# Patient Record
Sex: Male | Born: 1935 | Race: White | Hispanic: No | Marital: Married | Smoking: Former smoker
Health system: Southern US, Community
[De-identification: ages and names within clinical notes are randomized; demographics above are authoritative.]

## PROBLEM LIST (undated history)

## (undated) DIAGNOSIS — K219 Gastro-esophageal reflux disease without esophagitis: Secondary | ICD-10-CM

## (undated) DIAGNOSIS — I5189 Other ill-defined heart diseases: Secondary | ICD-10-CM

## (undated) DIAGNOSIS — I219 Acute myocardial infarction, unspecified: Secondary | ICD-10-CM

## (undated) DIAGNOSIS — I10 Essential (primary) hypertension: Secondary | ICD-10-CM

## (undated) DIAGNOSIS — I509 Heart failure, unspecified: Secondary | ICD-10-CM

## (undated) DIAGNOSIS — I255 Ischemic cardiomyopathy: Secondary | ICD-10-CM

## (undated) DIAGNOSIS — I5022 Chronic systolic (congestive) heart failure: Secondary | ICD-10-CM

## (undated) DIAGNOSIS — I639 Cerebral infarction, unspecified: Secondary | ICD-10-CM

## (undated) DIAGNOSIS — M1990 Unspecified osteoarthritis, unspecified site: Secondary | ICD-10-CM

## (undated) DIAGNOSIS — Z95810 Presence of automatic (implantable) cardiac defibrillator: Secondary | ICD-10-CM

## (undated) DIAGNOSIS — E7800 Pure hypercholesterolemia, unspecified: Secondary | ICD-10-CM

## (undated) DIAGNOSIS — R0602 Shortness of breath: Secondary | ICD-10-CM

## (undated) DIAGNOSIS — I447 Left bundle-branch block, unspecified: Secondary | ICD-10-CM

## (undated) DIAGNOSIS — I350 Nonrheumatic aortic (valve) stenosis: Secondary | ICD-10-CM

## (undated) DIAGNOSIS — I519 Heart disease, unspecified: Secondary | ICD-10-CM

## (undated) HISTORY — DX: Heart disease, unspecified: I51.9

## (undated) HISTORY — DX: Chronic systolic (congestive) heart failure: I50.22

## (undated) HISTORY — DX: Other ill-defined heart diseases: I51.89

## (undated) HISTORY — DX: Essential (primary) hypertension: I10

## (undated) HISTORY — PX: CATARACT EXTRACTION: SUR2

## (undated) HISTORY — PX: PARTIAL HIP ARTHROPLASTY: SHX733

## (undated) HISTORY — DX: Pure hypercholesterolemia, unspecified: E78.00

## (undated) HISTORY — DX: Nonrheumatic aortic (valve) stenosis: I35.0

## (undated) HISTORY — DX: Left bundle-branch block, unspecified: I44.7

## (undated) HISTORY — DX: Gastro-esophageal reflux disease without esophagitis: K21.9

---

## 2004-08-21 ENCOUNTER — Ambulatory Visit: Payer: Self-pay | Admitting: Family Medicine

## 2005-02-12 ENCOUNTER — Ambulatory Visit: Payer: Self-pay | Admitting: Family Medicine

## 2005-08-13 ENCOUNTER — Ambulatory Visit: Payer: Self-pay | Admitting: Family Medicine

## 2008-04-13 ENCOUNTER — Observation Stay (HOSPITAL_COMMUNITY): Admission: EM | Admit: 2008-04-13 | Discharge: 2008-04-15 | Payer: Self-pay | Admitting: Emergency Medicine

## 2008-04-13 ENCOUNTER — Ambulatory Visit: Payer: Self-pay | Admitting: Cardiovascular Disease

## 2010-05-05 ENCOUNTER — Ambulatory Visit: Payer: Self-pay | Admitting: Cardiology

## 2010-11-05 ENCOUNTER — Ambulatory Visit (INDEPENDENT_AMBULATORY_CARE_PROVIDER_SITE_OTHER): Payer: Medicare Other | Admitting: Cardiology

## 2010-11-05 DIAGNOSIS — I4460 Unspecified fascicular block: Secondary | ICD-10-CM

## 2010-11-05 DIAGNOSIS — E7800 Pure hypercholesterolemia, unspecified: Secondary | ICD-10-CM

## 2010-11-05 DIAGNOSIS — I119 Hypertensive heart disease without heart failure: Secondary | ICD-10-CM

## 2010-11-17 ENCOUNTER — Ambulatory Visit (INDEPENDENT_AMBULATORY_CARE_PROVIDER_SITE_OTHER): Payer: Medicare Other | Admitting: *Deleted

## 2010-11-17 DIAGNOSIS — R0989 Other specified symptoms and signs involving the circulatory and respiratory systems: Secondary | ICD-10-CM

## 2010-11-17 DIAGNOSIS — I10 Essential (primary) hypertension: Secondary | ICD-10-CM

## 2010-11-18 ENCOUNTER — Encounter: Payer: Self-pay | Admitting: Cardiology

## 2010-11-18 DIAGNOSIS — R0989 Other specified symptoms and signs involving the circulatory and respiratory systems: Secondary | ICD-10-CM | POA: Insufficient documentation

## 2010-11-19 ENCOUNTER — Ambulatory Visit: Payer: Medicare Other | Admitting: Cardiology

## 2010-11-20 ENCOUNTER — Encounter (INDEPENDENT_AMBULATORY_CARE_PROVIDER_SITE_OTHER): Payer: Medicare Other

## 2010-11-20 ENCOUNTER — Encounter: Payer: Self-pay | Admitting: Cardiology

## 2010-11-20 DIAGNOSIS — I10 Essential (primary) hypertension: Secondary | ICD-10-CM

## 2010-11-20 DIAGNOSIS — I714 Abdominal aortic aneurysm, without rupture: Secondary | ICD-10-CM

## 2010-11-20 DIAGNOSIS — I723 Aneurysm of iliac artery: Secondary | ICD-10-CM

## 2010-11-25 NOTE — Miscellaneous (Signed)
Summary: Orders Update  Clinical Lists Changes  Problems: Added new problem of ABDOMINAL BRUIT (ICD-785.9) Orders: Added new Test order of Renal Artery Duplex (Renal Artery Duplex) - Signed

## 2010-12-01 ENCOUNTER — Ambulatory Visit (INDEPENDENT_AMBULATORY_CARE_PROVIDER_SITE_OTHER): Payer: Medicare Other | Admitting: Nurse Practitioner

## 2010-12-01 DIAGNOSIS — Z7901 Long term (current) use of anticoagulants: Secondary | ICD-10-CM

## 2010-12-01 DIAGNOSIS — I359 Nonrheumatic aortic valve disorder, unspecified: Secondary | ICD-10-CM

## 2010-12-01 DIAGNOSIS — I10 Essential (primary) hypertension: Secondary | ICD-10-CM

## 2011-02-16 NOTE — H&P (Signed)
NAME:  Corey Mccullough, Corey Mccullough                  ACCOUNT NO.:  [id number]   MEDICAL RECORD NO.:  [id number]          PATIENT TYPE:  EMS   LOCATION:  MAJO                         FACILITY:  [facility]   PHYSICIAN:  Christell Faith, MD   DATE OF BIRTH:  01-16-1935   DATE OF ADMISSION:  04/13/2008  DATE OF DISCHARGE:                              HISTORY & PHYSICAL   The patient is being admitted to Dr. Lady Deutscher with [facility].   CHIEF COMPLAINT:  Racing heart.   PRIMARY CARE PHYSICIAN:  Dina Rich in [place], [place].   HISTORY OF PRESENT ILLNESS:  This is a 76 year old white man with a  history of irregular heartbeat and weak heart who was resting in his  chair today when he felt sudden onset of racing heart.  This lasted  greater than 2 hours until EMS gave IV diltiazem.  We do not have the  strips of the onset or termination of the rhythm.  The patient denies  chest pain or shortness of breath with the episode.  He denies syncope,  but he did feel mildly dizzy trying to ambulate while tachycardic.  Similar episodes have happened 2-3 times prior, most recently 6 weeks  ago.  In the past, these episodes have terminated spontaneously after 30-  45 minutes.  These racing heart episodes are different from his  irregular heartbeat, which is nonsustained.  The patient denies recent  angina or congestive heart failure symptoms.  He was evaluated one time  by [facility] and states that he had a negative nuclear stress  test, that he also states that he has been told that he has had a  myocardial infarction, although he was never admitted to the hospital  for that and doesn't remember it happening.   PAST MEDICAL HISTORY:  1. Hypertension.  2. Hyperlipidemia.  3. Irregular heartbeat, details unclear.  4. Status post TIA in 2000, which affected the right side of his body      and for which he was hospitalized.  He has had almost full      recovery.  5. GERD.  6.  Status post left hip fracture and hemiarthroplasty  7. Arthritis of the large weightbearing joints.  8. Restless leg syndrome.  9. Borderline diabetes.   SOCIAL HISTORY:  [place].  He  works part-time Water quality scientist.  He smokes a pack and half a  day of cigarettes for 50 years, but quit 5 years ago.  He admits to  regular alcohol use, but does not quantitate it.  His family indicates  that he has fairly heavy alcohol consumption.  He denies caffeine use.   FAMILY HISTORY:  The patient is unaware of any of his family members'  medical history.   ALLERGIES:  No known drug allergies.   MEDICATIONS:  1. Diovan HCT 160/12.5 mg p.o. daily.  2. Coreg 12.5 mg p.o. b.i.d.  3. Lipitor 80 mg p.o. daily.  4. Aspirin 325 mg p.o. daily.  5. Prilosec 20 mg p.o. daily.   REVIEW  OF SYSTEMS:  Positive for palpitations, occasionally irregular  heartbeat, joint pain and GERD symptoms.  Otherwise, the balance of 14  systems are reviewed and is negative.   PHYSICAL EXAMINATION:  VITAL SIGNS:  Temperature 100.0; pulse earlier  was 166 beats a minute, currently is 69 beats per minute; respiratory  rate 12; blood pressure 92/62 initially, currently 114/70; and  saturation 98% on room air.  GENERAL:  This is a very pleasant stoic white man in no acute distress.  HEENT:  Pupils are equal, round, and reactive.  Sclerae are clear.  Mucous membranes are moist.  There are no oral lesions.  He has top and  bottom dentures.  NECK:  Supple.  There is no cervical lymphadenopathy.  No carotid  bruits.  Carotid upstrokes are normal.  Neck veins are flat.  LUNGS:  Clear to auscultation bilaterally without any wheezing or rales.  There  is normal chest wall expansion.  CARDIAC:  Normal rate and regular rhythm.  No gallop.  There is a 2/6  systolic ejection murmur heard at the base of the heart and a 2/6 soft  holosystolic murmur heard in the mid axillary line.   ABDOMEN:  Protuberant, but soft and nontender.  Normal bowel sounds and  no bruits.  EXTREMITIES:  Reveal no edema.  No petechiae.  No clubbing, or cyanosis.  There is chronic toenail fungus. MUSCULOSKELETAL:  No acute joint  deformities or swelling.  NEURO:  There  is an intention tremor with both hands, right greater  than left.  Otherwise, the patient is awake, alert, and oriented x3 with  5/5 strength in all 4 extremities and normal sensation.  SKIN:  No rash or lesions.   DIAGNOSTIC TESTS:  Chest -Ray reveals borderline cardiomegaly with no  acute process.   EKG #1 which was obtained by EMS shows a wide complex tachycardia at 166  beats per minute with a left bundle branch block morphology.  Based on  Brugada criteria for left bundle branch morphology, this appears to be  SVT.  EKG #2 performed 2 hours later shows normal sinus rhythm with a  rate of 73 beats per minute and a prolonged PR interval at 262  milliseconds.   LABORATORY VALUES:  Labs reveal point of care CK-MB 2.3 and point of  care troponin 0.07.  White blood cell 5.6, hemoglobin 13.9, platelets  206, and INR 1.0.  Sodium 136, potassium 4.5, glucose 117, BUN 11,  creatinine 1.1, AST 20, ALT 2.4, albumin 3.2, and total bilirubin 0.7.   IMPRESSION:  This is a 76 year old white man with a wide complex  tachycardia today, which is probably SVT.  It resolved with diltiazem,  and he is currently asymptomatic.  Point of care troponin is mildly  abnormal.   PLAN:  1. Admit to telemetry to Dr. Lady Deutscher.  Rule out myocardial      infarction by cycling serial EKGs and cardiac enzymes.  2. We will check transthoracic echocardiogram.  3. For his SVT, ideally we would increase his beta-blocker; however,      first-degree heart block may inhibit our ability to do this.  We      will check transthoracic echo first and adjust medicines based on      the results.  4. Check fasting lipid panel, TSH, and magnesium level.  5.  We will continue his statin, aspirin, and antihypertensives.  6. The patient may need outpatient Myoview.  We will attempt to obtain  the results of his stress test from      [facility].  He is unclear when that was done.  7. Consider EP consult for possible SVT ablation if the patient is      interested.  8. The patient will be ambulatory and should not require DVT      prophylaxis.      Christell Faith, MD  Electronically Signed     NDL/MEDQ  D:  04/13/2008  T:  04/14/2008  Job:  [id number]

## 2011-02-16 NOTE — Discharge Summary (Signed)
NAME:  Corey Mccullough, Corey Mccullough                  ACCOUNT NO.:  [id number]   MEDICAL RECORD NO.:  [id number]          PATIENT TYPE:  OBV   LOCATION:  3711                         FACILITY:  [facility]   PHYSICIAN:  Elmore Guise., M.D.DATE OF BIRTH:  1935-07-22   DATE OF ADMISSION:  04/13/2008  DATE OF DISCHARGE:  04/15/2008                               DISCHARGE SUMMARY   DISCHARGE DIAGNOSES:  1. Palpitations with wide complex tachycardia.  2. Hypertension.  3. Dyslipidemia.  4. Gastroesophageal reflux disease.  5. Obesity.   HISTORY OF PRESENT ILLNESS:  Corey Mccullough is a very pleasant 76 year old  white male who presented with palpitations.  The patient reports he has  been having these spells for the last couple years.  He awoke with his  heart racing and pounding.  This did not improve over the next 30  minutes; EMS was notified.  On arrival, EMS noted that he was  tachycardic with heart rates in the 150-160 range with wide complex  morphology.  This was initially thought SVT with aberrancy.  He was  given diltiazem IV with resolution.  Once he arrived in the emergency  room, his heart rate was normal sinus rhythm with rates in the 30s.  He  was admitted for observation.   HOSPITAL COURSE:  The patient's hospital course was uncomplicated.  He  was kept on telemetry monitoring for 36 hours.  He had no further  tachyarrhythmias.  We kept him on his normal medications including Coreg  12.5 mg twice daily.  His blood pressure was stable.  His cardiac  markers were negative.  His TSH was normal.  His renal function, LFTs,  hemoglobin A1c all were normal.  His ECG showed normal sinus rhythm with  old inferior MI (inferior Q-waves) with no ST or T wave changes.  The  patient does report that he has had a workup with [facility] in  [place] but would like to transfer care to [facility] for further  evaluation.  Since the patient had no further events, he will be  discharged today.   DISCHARGE MEDICATIONS:  Include:  1. Diovan/hydrochlorothiazide 160/12.5 mg daily.  2. Lipitor 80 mg daily.  3. Aspirin 325 mg daily.  4. Prilosec 20 mg daily.  5. Coreg 12.5 mg twice daily.  6. Diltiazem 30 mg p.o. q.8 hours p.r.n. palpitations.   His followup appointment will be with Dr. Reyes Mccullough in 2-3 weeks.  I will  try and get his most recent evaluation from [facility] in  [place].  He is to call the office if he has any further problems.     Elmore Guise., M.D.  Electronically Signed    TWK/MEDQ  D:  04/15/2008  T:  04/15/2008  Job:  [id number]

## 2011-03-02 ENCOUNTER — Encounter: Payer: Self-pay | Admitting: Cardiology

## 2011-03-02 ENCOUNTER — Ambulatory Visit (INDEPENDENT_AMBULATORY_CARE_PROVIDER_SITE_OTHER): Payer: Medicare Other | Admitting: Cardiology

## 2011-03-02 DIAGNOSIS — I5042 Chronic combined systolic (congestive) and diastolic (congestive) heart failure: Secondary | ICD-10-CM | POA: Insufficient documentation

## 2011-03-02 DIAGNOSIS — I359 Nonrheumatic aortic valve disorder, unspecified: Secondary | ICD-10-CM

## 2011-03-02 DIAGNOSIS — I447 Left bundle-branch block, unspecified: Secondary | ICD-10-CM | POA: Insufficient documentation

## 2011-03-02 DIAGNOSIS — I119 Hypertensive heart disease without heart failure: Secondary | ICD-10-CM | POA: Insufficient documentation

## 2011-03-02 DIAGNOSIS — E7800 Pure hypercholesterolemia, unspecified: Secondary | ICD-10-CM | POA: Insufficient documentation

## 2011-03-02 DIAGNOSIS — I10 Essential (primary) hypertension: Secondary | ICD-10-CM

## 2011-03-02 DIAGNOSIS — I714 Abdominal aortic aneurysm, without rupture: Secondary | ICD-10-CM | POA: Insufficient documentation

## 2011-03-02 DIAGNOSIS — I350 Nonrheumatic aortic (valve) stenosis: Secondary | ICD-10-CM | POA: Insufficient documentation

## 2011-03-02 NOTE — Progress Notes (Signed)
Corey Mccullough Date of Birth:  1935-04-26 [facility] [address]   [address] [place], [place]  [address] [phone]           Fax   [phone]  History of Present Illness: This pleasant 76 year old gentleman is a former patient of Dr. Reyes Ivan.  He has a history of essential hypertension and a history of hypercholesterolemia.  He also has a known abdominal aortic aneurysm.  He's had dyslipidemia.  He had a nuclear stress test done in 2008 at [facility] using adenosine protocol and he showed no ischemia but he showed an old myocardial infarction involving the inferior wall and his ejection fraction was 47%.  He had an echocardiogram done here on 10/29/09 showing mild to moderate aortic stenosis and showing moderate LVH with global left ventricular systolic dysfunction and impaired relaxation with an ejection fraction of 35-40%.  He has a new left bundle-branch block noted in February 2012  Current Outpatient Prescriptions  Medication Sig Dispense Refill  . aspirin 325 MG tablet Take 325 mg by mouth daily.        Marland Kitchen atorvastatin (LIPITOR) 80 MG tablet Take 80 mg by mouth daily.        . carvedilol (COREG) 12.5 MG tablet Take 1 tablet by mouth Twice daily.      Marland Kitchen diltiazem (CARDIZEM) 30 MG tablet Take 1 tablet by mouth as needed.      Marland Kitchen losartan-hydrochlorothiazide (HYZAAR) 100-25 MG per tablet Take by mouth. 1/2 tablet (2) two times daily       . omeprazole (PRILOSEC) 20 MG capsule Take 20 mg by mouth daily.        . enalapril (VASOTEC) 20 MG tablet Take 1 tablet by mouth Twice daily.        No Known Allergies  Patient Active Problem List  Diagnoses  . ABDOMINAL BRUIT  . Abdominal aortic aneurysm  . Aortic stenosis  . Left bundle branch block  . Hypercholesterolemia  . Chronic combined systolic and diastolic congestive heart failure  . Essential hypertension    History  Smoking status  . Former Smoker -- 1.0 packs/day for 50 years  . Types:  Cigarettes  . Quit date: 03/01/2006  Smokeless tobacco  . Never Used    History  Alcohol Use No    Family History  Problem Relation Age of Onset  . Adopted: Yes    Review of Systems: Constitutional: no fever chills diaphoresis or fatigue or change in weight.  Head and neck: no hearing loss, no epistaxis, no photophobia or visual disturbance. Respiratory: No cough, shortness of breath or wheezing. Cardiovascular: No chest pain peripheral edema, palpitations. Gastrointestinal: No abdominal distention, no abdominal pain, no change in bowel habits hematochezia or melena. Genitourinary: No dysuria, no frequency, no urgency, no nocturia. Musculoskeletal:No arthralgias, no back pain, no gait disturbance or myalgias. Neurological: No dizziness, no headaches, no numbness, no seizures, no syncope, no weakness, no tremors. Hematologic: No lymphadenopathy, no easy bruising. Psychiatric: No confusion, no hallucinations, no sleep disturbance.    Physical Exam: Filed Vitals:   03/02/11 1139  BP: 136/88  Pulse: 80  The general appearance feels a well-developed well-nourished gentleman in no distress.Pupils equal and reactive.   Extraocular Movements are full.  There is no scleral icterus.  The mouth and pharynx are normal.  The neck is supple.  The carotids reveal no bruits.  The jugular venous pressure is normal.  The thyroid is not enlarged.  There is no  lymphadenopathy.The chest is clear to percussion and auscultation. There are no rales or rhonchi. Expansion of the chest is symmetrical.  Heart reveals a grade 2/6 systolic ejection murmur at the base.  No diastolic murmur.  No gallop or rub.The abdomen is soft and nontender. Bowel sounds are normal. The liver and spleen are not enlarged. There Are no abdominal masses. There are no bruits.The pedal pulses are good.  There is no phlebitis or edema.  There is no cyanosis or clubbing.Strength is normal and symmetrical in all extremities.  There is  no lateralizing weakness.  There are no sensory deficits.   Assessment / Plan: Continue present medication.  Continue to limit dietary salt.  Recheck in 4 months for followup office visit and fasting blood work

## 2011-03-02 NOTE — Assessment & Plan Note (Signed)
The patient has a history of essential hypertension.  Previously he was using a lot of dietary salt.  He cut back significantly on his dietary salt and his blood pressure has improved.

## 2011-03-02 NOTE — Assessment & Plan Note (Signed)
The patient denies of the cardinal symptoms of significant aortic stenosis such as angina pectoris, dyspnea on exertion, or exertional dizziness or syncope.  His last echocardiogram was 10/29/09 and showed a mean gradient of 21 mm mercury across his aortic valve

## 2011-03-02 NOTE — Assessment & Plan Note (Signed)
The patient has not been having any significant exertional dyspnea nor any signs or symptoms of exacerbation of his chronic systolic and diastolic heart failure.  He has not been experiencing any pitting edema.

## 2011-03-02 NOTE — Assessment & Plan Note (Signed)
The patient was found to have a small abdominal aortic aneurysm.  This was found incidentally when he was having a renal duplex for renal artery stenosis.  His renal artery duplex was negative for renal artery stenosis.  He subsequently had a ultrasound of his aneurysm showing it to be 3.7 x 3.8 cm.  He's not having any symptoms from the aneurysm and he'll get a followup ultrasound of his abdominal Aortic aneurysm in one year

## 2011-06-15 ENCOUNTER — Encounter: Payer: Self-pay | Admitting: Cardiology

## 2011-06-15 ENCOUNTER — Other Ambulatory Visit: Payer: Self-pay | Admitting: Cardiology

## 2011-06-15 NOTE — Telephone Encounter (Signed)
Refilled Meds from fax  

## 2011-06-24 ENCOUNTER — Ambulatory Visit (INDEPENDENT_AMBULATORY_CARE_PROVIDER_SITE_OTHER): Payer: Medicare Other | Admitting: Cardiology

## 2011-06-24 ENCOUNTER — Encounter: Payer: Self-pay | Admitting: Cardiology

## 2011-06-24 ENCOUNTER — Ambulatory Visit (INDEPENDENT_AMBULATORY_CARE_PROVIDER_SITE_OTHER): Payer: Medicare Other | Admitting: *Deleted

## 2011-06-24 VITALS — BP 118/78 | HR 88 | Wt 215.0 lb

## 2011-06-24 DIAGNOSIS — I447 Left bundle-branch block, unspecified: Secondary | ICD-10-CM

## 2011-06-24 DIAGNOSIS — I119 Hypertensive heart disease without heart failure: Secondary | ICD-10-CM

## 2011-06-24 DIAGNOSIS — I359 Nonrheumatic aortic valve disorder, unspecified: Secondary | ICD-10-CM

## 2011-06-24 DIAGNOSIS — I350 Nonrheumatic aortic (valve) stenosis: Secondary | ICD-10-CM

## 2011-06-24 DIAGNOSIS — E7800 Pure hypercholesterolemia, unspecified: Secondary | ICD-10-CM

## 2011-06-24 DIAGNOSIS — I714 Abdominal aortic aneurysm, without rupture: Secondary | ICD-10-CM

## 2011-06-24 DIAGNOSIS — I10 Essential (primary) hypertension: Secondary | ICD-10-CM

## 2011-06-24 LAB — BASIC METABOLIC PANEL
BUN: 24 mg/dL — ABNORMAL HIGH (ref 6–23)
CO2: 29 mEq/L (ref 19–32)
Glucose, Bld: 99 mg/dL (ref 70–99)
Potassium: 5.1 mEq/L (ref 3.5–5.1)
Sodium: 136 mEq/L (ref 135–145)

## 2011-06-24 LAB — LIPID PANEL
HDL: 48.7 mg/dL (ref >39.00)
Total CHOL/HDL Ratio: 3
VLDL: 56.8 mg/dL — ABNORMAL HIGH (ref 0.0–40.0)

## 2011-06-24 LAB — HEPATIC FUNCTION PANEL
Alkaline Phosphatase: 94 U/L (ref 39–117)
Bilirubin, Direct: 0.3 mg/dL (ref 0.0–0.3)
Total Bilirubin: 1.2 mg/dL (ref 0.3–1.2)

## 2011-06-24 NOTE — Progress Notes (Signed)
Corey Mccullough Date of Birth:  [birth year] [facility] / [facility] [address]   [address] [place], [place]  [address] [phone]           Fax   [phone]  History of Present Illness: This pleasant 76 year old gentleman is a former patient of Dr. Reyes Ivan.  He has a history of essential hypertension and a history of hypercholesterolemia.  He also has a known abdominal aortic aneurysm.  He has a history of dyslipidemia.  He had a nuclear stress test done in 2008 showing an old myocardial infarction and an ejection fraction of 47%.  He had an echocardiogram in 10/29/09 showing mild to moderate aortic stenosis and showing moderate LVH with global left ventricular systolic dysfunction and impaired relaxation and his ejection fraction was 35-40%.  He has a known left bundle-branch block.  Current Outpatient Prescriptions  Medication Sig Dispense Refill  . aspirin 325 MG tablet Take 325 mg by mouth daily.        Marland Kitchen atorvastatin (LIPITOR) 80 MG tablet Take 80 mg by mouth daily.        . carvedilol (COREG) 12.5 MG tablet Take 1 tablet by mouth Twice daily.      Marland Kitchen diltiazem (CARDIZEM) 30 MG tablet TAKE ONE TABLET BY MOUTH EVERY 6 HOURS AS NEEDED FOR PALPITATION  30 tablet  6  . losartan-hydrochlorothiazide (HYZAAR) 100-25 MG per tablet Take by mouth. 1/2 tablet (2) two times daily       . omeprazole (PRILOSEC) 20 MG capsule Take 20 mg by mouth daily.        . enalapril (VASOTEC) 20 MG tablet Take 1 tablet by mouth Twice daily.        No Known Allergies  Patient Active Problem List  Diagnoses  . ABDOMINAL BRUIT  . Abdominal aortic aneurysm  . Aortic stenosis  . Left bundle branch block  . Hypercholesterolemia  . Chronic combined systolic and diastolic congestive heart failure  . Essential hypertension    History  Smoking status  . Former Smoker -- 1.0 packs/day for 50 years  . Types: Cigarettes  . Quit date: 03/01/2006  Smokeless tobacco  . Never Used    History    Alcohol Use No    Family History  Problem Relation Age of Onset  . Adopted: Yes    Review of Systems: Constitutional: no fever chills diaphoresis or fatigue or change in weight.  Head and neck: no hearing loss, no epistaxis, no photophobia or visual disturbance. Respiratory: No cough, shortness of breath or wheezing. Cardiovascular: No chest pain peripheral edema, palpitations. Gastrointestinal: No abdominal distention, no abdominal pain, no change in bowel habits hematochezia or melena. Genitourinary: No dysuria, no frequency, no urgency, no nocturia. Musculoskeletal:No arthralgias, no back pain, no gait disturbance or myalgias. Neurological: No dizziness, no headaches, no numbness, no seizures, no syncope, no weakness, no tremors. Hematologic: No lymphadenopathy, no easy bruising. Psychiatric: No confusion, no hallucinations, no sleep disturbance.    Physical Exam: Filed Vitals:   06/24/11 0845  BP: 118/78  Pulse: 88  The general appearance reveals a well-developed well-nourished elderly gentleman in no distress.Pupils equal and reactive.   Extraocular Movements are full.  There is no scleral icterus.  The mouth and pharynx are normal.  The neck is supple.  The carotids reveal no bruits.  The jugular venous pressure is normal.  The thyroid is not enlarged.  There is no lymphadenopathy.  The chest is clear to percussion and auscultation. There are  no rales or rhonchi. Expansion of the chest is symmetrical.    The heart reveals a grade 2/6 harsh systolic ejection murmur at the base radiating to the neck no diastolic murmur.  No gallop or rub.The abdomen is soft and nontender. Bowel sounds are normal. The liver and spleen are not enlarged. There Are no abdominal masses. There are no bruits.    I don't feel any aneurysm.The pedal pulses are good.  There is no phlebitis or edema.  There is no cyanosis or clubbing.  Strength is normal and symmetrical in all extremities.  There is no  lateralizing weakness.  There are no sensory deficits.  The skin is warm and dry.  There is no rash.    Assessment / Plan: Continue same medication.  Recheck in 6 months for followup office visit fasting lab work and EKG.  He requests that we mail him a copy of today's labs since he does not hear well on the telephone.

## 2011-06-24 NOTE — Assessment & Plan Note (Signed)
The patient has not been experiencing any exertional chest pain or angina his not having any symptoms of CHF or edema.  His weight is up 1 pound since last visit.

## 2011-06-24 NOTE — Assessment & Plan Note (Addendum)
The patient has a history of hypercholesterolemia.  He is on 80 mg of Lipitor daily.  His most recent labsAre satisfactory with an LDL of 77.He denies any myalgias from his Lipitor

## 2011-06-25 ENCOUNTER — Telehealth: Payer: Self-pay | Admitting: *Deleted

## 2011-06-25 NOTE — Telephone Encounter (Signed)
Message copied by Burnell Blanks on Fri Jun 25, 2011 11:05 AM ------      Message from: Cassell Clement      Created: Thu Jun 24, 2011  9:32 PM       Please report.The chemistries are satisfactory.  The liver tests are normal.The cholesterol and LDL are normal.  The triglycerides are high at 284 and he needs to watch carbohydrates carefully and to lose weight

## 2011-06-25 NOTE — Telephone Encounter (Signed)
Mailed to patient no phone called placed  per patient request.

## 2011-07-01 LAB — CARDIAC PANEL(CRET KIN+CKTOT+MB+TROPI)
CK, MB: 3.7
CK, MB: 3.9
Relative Index: INVALID
Relative Index: INVALID
Total CK: 68
Troponin I: 0.3 — ABNORMAL HIGH
Troponin I: 0.36 — ABNORMAL HIGH

## 2011-07-01 LAB — PROTIME-INR
INR: 1
Prothrombin Time: 13.6

## 2011-07-01 LAB — COMPREHENSIVE METABOLIC PANEL
AST: 28
BUN: 11
CO2: 27
Chloride: 104
Creatinine, Ser: 1.12
GFR calc Af Amer: >60
GFR calc non Af Amer: >60
Glucose, Bld: 117 — ABNORMAL HIGH
Total Bilirubin: 0.7

## 2011-07-01 LAB — BASIC METABOLIC PANEL
BUN: 17
Calcium: 9.2
GFR calc non Af Amer: >60
Glucose, Bld: 125 — ABNORMAL HIGH

## 2011-07-01 LAB — DIFFERENTIAL
Basophils Absolute: 0
Eosinophils Relative: 2
Lymphocytes Relative: 39
Neutrophils Relative %: 48

## 2011-07-01 LAB — CBC
HCT: 40.4
Hemoglobin: 13.9
MCHC: 34.4
MCV: 95.5
RBC: 4.24
WBC: 5.6

## 2011-07-01 LAB — HEMOGLOBIN A1C: Hgb A1c MFr Bld: 6.4 — ABNORMAL HIGH

## 2011-07-01 LAB — POCT CARDIAC MARKERS: Operator id: 294521

## 2011-07-01 LAB — TSH: TSH: 4.586 — ABNORMAL HIGH

## 2011-08-12 ENCOUNTER — Other Ambulatory Visit: Payer: Self-pay | Admitting: Cardiology

## 2011-12-23 ENCOUNTER — Encounter: Payer: Self-pay | Admitting: Cardiology

## 2011-12-23 ENCOUNTER — Ambulatory Visit (INDEPENDENT_AMBULATORY_CARE_PROVIDER_SITE_OTHER): Payer: Medicare Other | Admitting: Cardiology

## 2011-12-23 VITALS — BP 120/90 | HR 68 | Ht 72.0 in | Wt 219.0 lb

## 2011-12-23 DIAGNOSIS — I119 Hypertensive heart disease without heart failure: Secondary | ICD-10-CM

## 2011-12-23 DIAGNOSIS — I447 Left bundle-branch block, unspecified: Secondary | ICD-10-CM

## 2011-12-23 DIAGNOSIS — I359 Nonrheumatic aortic valve disorder, unspecified: Secondary | ICD-10-CM

## 2011-12-23 DIAGNOSIS — E7800 Pure hypercholesterolemia, unspecified: Secondary | ICD-10-CM

## 2011-12-23 DIAGNOSIS — I10 Essential (primary) hypertension: Secondary | ICD-10-CM

## 2011-12-23 DIAGNOSIS — I350 Nonrheumatic aortic (valve) stenosis: Secondary | ICD-10-CM

## 2011-12-23 DIAGNOSIS — I714 Abdominal aortic aneurysm, without rupture: Secondary | ICD-10-CM

## 2011-12-23 MED ORDER — LOSARTAN POTASSIUM-HCTZ 100-25 MG PO TABS: ORAL_TABLET | ORAL | Status: DC

## 2011-12-23 NOTE — Assessment & Plan Note (Signed)
The patient has a long history of essential hypertension.  Treatment has been difficult because the patient admits to eating a lot of salt on his food.  We talked about the importance of cutting back his salt.  The patient is not having a dizzy spells or shortness of breath

## 2011-12-23 NOTE — Progress Notes (Signed)
Corey Mccullough Date of Birth:  10-07-1934 [facility] [address] [place], [place]  [address] [phone]         Fax   [phone]  History of Present Illness: This pleasant 77 year old gentleman is a former patient of Dr. Reyes Ivan.  He has a history of essential hypertension and a history of hypercholesterolemia.  He also has a known abdominal aortic aneurysm and his last duplex ultrasound was in February 2012.  The patient has not been experiencing any symptoms from his aneurysm.  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  A nuclear stress test done in 2008 showed an ejection fraction of 47%.  The patient had an echocardiogram in January 2011 showing mild to moderate aortic stenosis and showing global LV systolic dysfunction with impaired relaxation and an ejection fraction of 35-40%.  He has a known chronic left bundle-branch block.  Current Outpatient Prescriptions  Medication Sig Dispense Refill  . aspirin 325 MG tablet Take 325 mg by mouth daily.        Marland Kitchen atorvastatin (LIPITOR) 80 MG tablet Take 80 mg by mouth daily.        . carvedilol (COREG) 12.5 MG tablet TAKE ONE TABLET BY MOUTH TWICE DAILY  180 tablet  3  . diltiazem (CARDIZEM) 30 MG tablet TAKE ONE TABLET BY MOUTH EVERY 6 HOURS AS NEEDED FOR PALPITATION  30 tablet  6  . losartan-hydrochlorothiazide (HYZAAR) 100-25 MG per tablet 1/2 tablet (2) two times daily  90 tablet  3  . omeprazole (PRILOSEC) 20 MG capsule Take 20 mg by mouth daily.          No Known Allergies  Patient Active Problem List  Diagnoses  . ABDOMINAL BRUIT  . Abdominal aortic aneurysm  . Aortic stenosis  . Left bundle branch block  . Hypercholesterolemia  . Chronic combined systolic and diastolic congestive heart failure  . Essential hypertension    History  Smoking status  . Former Smoker -- 1.0 packs/day for 50 years  . Types: Cigarettes  . Quit date: 03/01/2006  Smokeless tobacco  . Never  Used    History  Alcohol Use No    Family History  Problem Relation Age of Onset  . Adopted: Yes    Review of Systems: Constitutional: no fever chills diaphoresis or fatigue or change in weight.  Head and neck: no hearing loss, no epistaxis, no photophobia or visual disturbance. Respiratory: No cough, shortness of breath or wheezing. Cardiovascular: No chest pain peripheral edema, palpitations. Gastrointestinal: No abdominal distention, no abdominal pain, no change in bowel habits hematochezia or melena. Genitourinary: No dysuria, no frequency, no urgency, no nocturia. Musculoskeletal:No arthralgias, no back pain, no gait disturbance or myalgias. Neurological: No dizziness, no headaches, no numbness, no seizures, no syncope, no weakness, no tremors. Hematologic: No lymphadenopathy, no easy bruising. Psychiatric: No confusion, no hallucinations, no sleep disturbance.    Physical Exam: Filed Vitals:   12/23/11 1515  BP: 120/90  Pulse: 68   the general appearance reveals a well-developed elderly gentleman in no distress.Pupils equal and reactive.   Extraocular Movements are full.  There is no scleral icterus.  The mouth and pharynx are normal.  The neck is supple.  The carotids reveal no bruits.  The jugular venous pressure is normal.  The thyroid is not enlarged.  There is no lymphadenopathy.  The chest is clear to percussion and auscultation. There are no rales or rhonchi. Expansion of the chest  is symmetrical.  Heart reveals a grade 2/6 systolic ejection murmur at the aortic area radiating toward the neck.  No gallop or rub.The abdomen is soft and nontender. Bowel sounds are normal. The liver and spleen are not enlarged. There Are no abdominal masses. There are no bruits.  The pedal pulses are good.  There is no phlebitis or edema.  There is no cyanosis or clubbing. Strength is normal and symmetrical in all extremities.  There is no lateralizing weakness.  There are no sensory  deficits.  The skin is warm and dry.  There is no rash.  EKG today shows normal sinus rhythm with incomplete left bundle branch block.  Since February 2012 his QRS width has lessened.    Assessment / Plan: The patient is to continue same medication.  He'll return in 6 months for followup office visit and get fasting lipid panel and chemistries than.  We will arrange for a duplex ultrasound evaluation of his known abdominal aortic aneurysm in the near future.  He was urged to cut down on dietary salt to help his blood pressure further.

## 2011-12-23 NOTE — Assessment & Plan Note (Signed)
The patient has not been having any symptoms of back pain or abdominal pain from his AAA.  We will update his duplex scan.

## 2011-12-23 NOTE — Assessment & Plan Note (Signed)
The patient has a history of mild to moderate aortic stenosis.  He is not having symptoms from his aortic stenosis.

## 2011-12-23 NOTE — Patient Instructions (Addendum)
Your physician has requested that you have an abdominal aorta duplex. During this test, an ultrasound is used to evaluate the aorta. Allow 30 minutes for this exam. Do not eat after midnight the day before and avoid carbonated beverages  Your physician recommends that you continue on your current medications as directed. Please refer to the Current Medication list given to you today.  Your physician wants you to follow-up in: 6 months  You will receive a reminder letter in the mail two months in advance. If you don't receive a letter, please call our office to schedule the follow-up appointment.

## 2011-12-27 ENCOUNTER — Encounter (INDEPENDENT_AMBULATORY_CARE_PROVIDER_SITE_OTHER): Payer: Medicare Other

## 2011-12-27 DIAGNOSIS — I714 Abdominal aortic aneurysm, without rupture: Secondary | ICD-10-CM

## 2012-01-04 ENCOUNTER — Telehealth: Payer: Self-pay | Admitting: Cardiology

## 2012-01-04 NOTE — Telephone Encounter (Signed)
PT RTN MELINDA'S CALL, PLS CALL

## 2012-01-04 NOTE — Telephone Encounter (Signed)
Message copied by Burnell Blanks on Tue Jan 04, 2012  4:25 PM ------      Message from: Cassell Clement      Created: Wed Dec 29, 2011  9:45 PM       Pl report.  The AAA has increased only very slightly since last year.  Repeat in 1 year.

## 2012-01-04 NOTE — Telephone Encounter (Signed)
Advised patient repeat in 1 year

## 2012-07-04 ENCOUNTER — Ambulatory Visit (INDEPENDENT_AMBULATORY_CARE_PROVIDER_SITE_OTHER): Payer: Medicare Other | Admitting: Cardiology

## 2012-07-04 ENCOUNTER — Encounter: Payer: Self-pay | Admitting: Cardiology

## 2012-07-04 ENCOUNTER — Ambulatory Visit (INDEPENDENT_AMBULATORY_CARE_PROVIDER_SITE_OTHER)
Admission: RE | Admit: 2012-07-04 | Discharge: 2012-07-04 | Disposition: A | Discharge status: Home / Self Care | Payer: Medicare Other | Source: Ambulatory Visit | Attending: Cardiology | Admitting: Cardiology

## 2012-07-04 ENCOUNTER — Other Ambulatory Visit (INDEPENDENT_AMBULATORY_CARE_PROVIDER_SITE_OTHER): Payer: Medicare Other

## 2012-07-04 VITALS — BP 129/82 | HR 77 | Ht 72.0 in | Wt 216.0 lb

## 2012-07-04 DIAGNOSIS — E7800 Pure hypercholesterolemia, unspecified: Secondary | ICD-10-CM

## 2012-07-04 DIAGNOSIS — I119 Hypertensive heart disease without heart failure: Secondary | ICD-10-CM

## 2012-07-04 DIAGNOSIS — I359 Nonrheumatic aortic valve disorder, unspecified: Secondary | ICD-10-CM

## 2012-07-04 DIAGNOSIS — R0609 Other forms of dyspnea: Secondary | ICD-10-CM

## 2012-07-04 DIAGNOSIS — I350 Nonrheumatic aortic (valve) stenosis: Secondary | ICD-10-CM

## 2012-07-04 DIAGNOSIS — I714 Abdominal aortic aneurysm, without rupture, unspecified: Secondary | ICD-10-CM

## 2012-07-04 DIAGNOSIS — R0989 Other specified symptoms and signs involving the circulatory and respiratory systems: Secondary | ICD-10-CM

## 2012-07-04 LAB — BASIC METABOLIC PANEL
BUN: 24 mg/dL — ABNORMAL HIGH (ref 6–23)
CO2: 27 mEq/L (ref 19–32)
Chloride: 100 mEq/L (ref 96–112)
Creatinine, Ser: 1.3 mg/dL (ref 0.4–1.5)
Potassium: 4.1 mEq/L (ref 3.5–5.1)

## 2012-07-04 LAB — HEPATIC FUNCTION PANEL
Albumin: 4.1 g/dL (ref 3.5–5.2)
Alkaline Phosphatase: 76 U/L (ref 39–117)
Bilirubin, Direct: 0.2 mg/dL (ref 0.0–0.3)
Total Protein: 8 g/dL (ref 6.0–8.3)

## 2012-07-04 LAB — LIPID PANEL
LDL Cholesterol: 96 mg/dL (ref 0–99)
Total CHOL/HDL Ratio: 3
VLDL: 29.6 mg/dL (ref 0.0–40.0)

## 2012-07-04 NOTE — Progress Notes (Signed)
Corey Mccullough Date of Birth:  December 22, 1934 [facility] [address] [place], [place]  [address] [phone]         Fax   [phone]  History of Present Illness: This pleasant 77 year old gentleman is a former patient of Dr. Reyes Ivan. He has a history of essential hypertension and a history of hypercholesterolemia. He also has a known abdominal aortic aneurysm and his last duplex ultrasound was in February 2012. The patient has not been experiencing any symptoms from his aneurysm. 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 A nuclear stress test done in 2008 showed an ejection fraction of 47%. The patient had an echocardiogram in January 2011 showing mild to moderate aortic stenosis and showing global LV systolic dysfunction with impaired relaxation and an ejection fraction of 35-40%. He has a known chronic left bundle-branch block.  His last chest x-ray was 04/13/08 and showed borderline cardiomegaly.  His last abdominal ultrasound was 12/27/11 and showed a small abdominal aortic aneurysm measuring 3.8 x 3.9 cm. Since last visit the patient has been doing well but has been experiencing some increased shortness of breath.  He has not been expressing any chest pain or peripheral edema.  He has had some numbness in his feet.  He is not known to be a diabetic.   Current Outpatient Prescriptions  Medication Sig Dispense Refill  . aspirin 325 MG tablet Take 325 mg by mouth daily.        Marland Kitchen atorvastatin (LIPITOR) 80 MG tablet Take 80 mg by mouth daily.        . carvedilol (COREG) 12.5 MG tablet TAKE ONE TABLET BY MOUTH TWICE DAILY  180 tablet  3  . diltiazem (CARDIZEM) 30 MG tablet TAKE ONE TABLET BY MOUTH EVERY 6 HOURS AS NEEDED FOR PALPITATION  30 tablet  6  . losartan-hydrochlorothiazide (HYZAAR) 100-25 MG per tablet 1/2 tablet (2) two times daily  90 tablet  3  . omeprazole (PRILOSEC) 20 MG capsule Take 20 mg by mouth daily.        Marland Kitchen DISCONTD:  enalapril (VASOTEC) 20 MG tablet Take 1 tablet by mouth Twice daily.        No Known Allergies  Patient Active Problem List  Diagnosis  . ABDOMINAL BRUIT  . Abdominal aortic aneurysm  . Aortic stenosis  . Left bundle branch block  . Hypercholesterolemia  . Chronic combined systolic and diastolic congestive heart failure  . Essential hypertension    History  Smoking status  . Former Smoker -- 1.0 packs/day for 50 years  . Types: Cigarettes  . Quit date: 03/01/2006  Smokeless tobacco  . Never Used    History  Alcohol Use No    Family History  Problem Relation Age of Onset  . Adopted: Yes    Review of Systems: Constitutional: no fever chills diaphoresis or fatigue or change in weight.  Head and neck: no hearing loss, no epistaxis, no photophobia or visual disturbance. Respiratory: No cough, shortness of breath or wheezing. Cardiovascular: No chest pain peripheral edema, palpitations. Gastrointestinal: No abdominal distention, no abdominal pain, no change in bowel habits hematochezia or melena. Genitourinary: No dysuria, no frequency, no urgency, no nocturia. Musculoskeletal:No arthralgias, no back pain, no gait disturbance or myalgias. Neurological: No dizziness, no headaches, no numbness, no seizures, no syncope, no weakness, no tremors. Hematologic: No lymphadenopathy, no easy bruising. Psychiatric: No confusion, no hallucinations, no sleep disturbance.    Physical Exam: Filed Vitals:  07/04/12 0950  BP: 129/82  Pulse: 77   the general appearance reveals a pleasant elderly gentleman in no distress.Pupils equal and reactive.   Extraocular Movements are full.  There is no scleral icterus.  The mouth and pharynx are normal.  The neck is supple.  The carotids reveal no bruits.  The jugular venous pressure is normal.  The thyroid is not enlarged.  There is no lymphadenopathy.  The chest is clear to percussion and auscultation. There are no rales or rhonchi. Expansion  of the chest is symmetrical.  The heart reveals a grade 2/6 systolic ejection murmur at the aortic area.  No diastolic murmur and no gallop or rub.The abdomen is soft and nontender. Bowel sounds are normal. The liver and spleen are not enlarged. There Are no abdominal masses. There are no bruits.  Extremities show 1+ pedal pulses and no phlebitis or edema.The skin is warm and dry.  There is no rash. Strength is normal and symmetrical in all extremities.  There is no lateralizing weakness.  There are no sensory deficits.     Assessment / Plan: Continue same meds for now.  Await results of today's lab work.  We will get a chest x-ray today.  He will return soon for a two-dimensional echocardiogram to update his aortic valve status. The patient will return in 6 months for followup office visit EKG CBC lipid panel hepatic function panel and basal metabolic panel.

## 2012-07-04 NOTE — Assessment & Plan Note (Signed)
The patient has a history of hypercholesterolemia.  He is on Lipitor 80 mg daily.  He is not having any myalgias.  Blood work is pending today

## 2012-07-04 NOTE — Progress Notes (Signed)
Quick Note:  Please report to patient. The recent labs are stable. Continue same medication and careful diet. BS high 111 so try to watch carbs and lose weight. ______

## 2012-07-04 NOTE — Patient Instructions (Signed)
Will have you go for Chest Xray at the [facility] building across from [facility]  Your physician has requested that you have an echocardiogram. Echocardiography is a painless test that uses sound waves to create images of your heart. It provides your doctor with information about the size and shape of your heart and how well your heart's chambers and valves are working. This procedure takes approximately one hour. There are no restrictions for this procedure.  Your physician recommends that you continue on your current medications as directed. Please refer to the Current Medication list given to you today.  Your physician wants you to follow-up in: 6 months with fasting labs (lp/bmet/hfp/cbc/ekg)  You will receive a reminder letter in the mail two months in advance. If you don't receive a letter, please call our office to schedule the follow-up appointment.

## 2012-07-04 NOTE — Assessment & Plan Note (Signed)
The patient has a history of high blood pressure.  Blood pressure has been stable on current therapy.  The patient has had some increased shortness of breath.  He thinks that it may be because he is still overweight he does have a past history of a cardiomyopathy with an old myocardial infarction and a previous ejection fraction of 35-40%.  We will plan to update his echocardiogram in his chest x-ray to evaluate his exertional dyspnea

## 2012-07-04 NOTE — Assessment & Plan Note (Signed)
No symptoms referable to his abdominal aortic aneurysm.  He will be due to have another ultrasound in March 2014.

## 2012-07-07 ENCOUNTER — Other Ambulatory Visit: Payer: Self-pay | Admitting: Cardiology

## 2012-07-10 NOTE — Telephone Encounter (Signed)
..   Requested Prescriptions   Pending Prescriptions Disp Refills  . diltiazem (CARDIZEM) 30 MG tablet [Pharmacy Med Name: DILTIAZEM 30MG       TAB] 30 tablet 5    Sig: TAKE ONE TABLET BY MOUTH EVERY 6 HOURS AS NEEDED FOR PALPITATION

## 2012-07-11 ENCOUNTER — Ambulatory Visit (HOSPITAL_COMMUNITY): Payer: Medicare Other | Attending: Cardiology

## 2012-07-11 DIAGNOSIS — I10 Essential (primary) hypertension: Secondary | ICD-10-CM | POA: Insufficient documentation

## 2012-07-11 DIAGNOSIS — I369 Nonrheumatic tricuspid valve disorder, unspecified: Secondary | ICD-10-CM | POA: Insufficient documentation

## 2012-07-11 DIAGNOSIS — I080 Rheumatic disorders of both mitral and aortic valves: Secondary | ICD-10-CM | POA: Insufficient documentation

## 2012-07-11 DIAGNOSIS — I359 Nonrheumatic aortic valve disorder, unspecified: Secondary | ICD-10-CM

## 2012-07-11 DIAGNOSIS — I379 Nonrheumatic pulmonary valve disorder, unspecified: Secondary | ICD-10-CM | POA: Insufficient documentation

## 2012-07-11 DIAGNOSIS — I350 Nonrheumatic aortic (valve) stenosis: Secondary | ICD-10-CM

## 2012-07-11 NOTE — Progress Notes (Signed)
Echocardiogram performed.  

## 2012-07-12 ENCOUNTER — Telehealth: Payer: Self-pay | Admitting: *Deleted

## 2012-07-12 NOTE — Telephone Encounter (Signed)
Message copied by Burnell Blanks on Wed Jul 12, 2012  5:44 PM ------      Message from: Cassell Clement      Created: Wed Jul 12, 2012  5:17 PM       Please report.  The echo shows that the EF is not as good this time. I would like to see him back in the next several weeks to discuss treatment options further with him. (he may benefit from CRT-ICD).

## 2012-07-12 NOTE — Telephone Encounter (Signed)
Advised patient and scheduled appointment on October 21, offered sooner but he was unable to come

## 2012-07-24 ENCOUNTER — Ambulatory Visit (INDEPENDENT_AMBULATORY_CARE_PROVIDER_SITE_OTHER): Payer: Medicare Other | Admitting: Cardiology

## 2012-07-24 ENCOUNTER — Encounter: Payer: Self-pay | Admitting: Cardiology

## 2012-07-24 VITALS — BP 167/100 | HR 77 | Ht 72.0 in | Wt 223.0 lb

## 2012-07-24 DIAGNOSIS — I25810 Atherosclerosis of coronary artery bypass graft(s) without angina pectoris: Secondary | ICD-10-CM

## 2012-07-24 DIAGNOSIS — I5042 Chronic combined systolic (congestive) and diastolic (congestive) heart failure: Secondary | ICD-10-CM

## 2012-07-24 DIAGNOSIS — I509 Heart failure, unspecified: Secondary | ICD-10-CM

## 2012-07-24 DIAGNOSIS — I447 Left bundle-branch block, unspecified: Secondary | ICD-10-CM

## 2012-07-24 NOTE — Progress Notes (Signed)
Corey Mccullough Date of Birth:  Nov 12, 1934 [facility] [address] [place], [place]  [address] [phone]         Fax   [phone]  History of Present Illness: This pleasant 77 year old gentleman is a former patient of Dr. Reyes Mccullough. He has a history of essential hypertension and a history of hypercholesterolemia. He also has a known abdominal aortic aneurysm and his last duplex ultrasound was in February 2012. The patient has not been experiencing any symptoms from his aneurysm. 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 A nuclear stress test done at [facility] cardiology in -- [facility] in 2008 showed an ejection fraction of 47% and it showed evidence of an old inferior wall myocardial infarction but no reversible ischemia. The patient had an echocardiogram in January 2011 showing mild to moderate aortic stenosis and showing global LV systolic dysfunction with impaired relaxation and an ejection fraction of 35-40%. He has a known chronic left bundle-branch block. His last chest x-ray was 04/13/08 and showed borderline cardiomegaly. His last abdominal ultrasound was 12/27/11 and showed a small abdominal aortic aneurysm measuring 3.8 x 3.9 cm.  The patient's most recent echocardiogram done on 07/11/12 shows an ejection fraction of 30-35% with akinesis of the inferior myocardium.  His aortic valve stenosis was moderate to severe there was mild mitral regurgitation and mild left atrial dilatation and reduced right ventricular systolic function. The patient has been experiencing some occasional chest tightness of a mild degree with activity.  He also notes shortness of breath and occasional palpitations with racing of his heart.   Current Outpatient Prescriptions  Medication Sig Dispense Refill  . aspirin 325 MG tablet Take 325 mg by mouth daily.        Marland Kitchen atorvastatin (LIPITOR) 80 MG tablet Take 80 mg by mouth daily.        . carvedilol (COREG)  12.5 MG tablet TAKE ONE TABLET BY MOUTH TWICE DAILY  180 tablet  3  . diltiazem (CARDIZEM) 30 MG tablet TAKE ONE TABLET BY MOUTH EVERY 6 HOURS AS NEEDED FOR PALPITATION  30 tablet  5  . losartan-hydrochlorothiazide (HYZAAR) 100-25 MG per tablet 1/2 tablet (2) two times daily  90 tablet  3  . omeprazole (PRILOSEC) 20 MG capsule Take 20 mg by mouth daily.        Marland Kitchen DISCONTD: enalapril (VASOTEC) 20 MG tablet Take 1 tablet by mouth Twice daily.        No Known Allergies  Patient Active Problem List  Diagnosis  . ABDOMINAL BRUIT  . Abdominal aortic aneurysm  . Aortic stenosis  . Left bundle branch block  . Hypercholesterolemia  . Chronic combined systolic and diastolic congestive heart failure  . Essential hypertension    History  Smoking status  . Former Smoker -- 1.0 packs/day for 50 years  . Types: Cigarettes  . Quit date: 03/01/2006  Smokeless tobacco  . Never Used    History  Alcohol Use No    Family History  Problem Relation Age of Onset  . Adopted: Yes    Review of Systems: Constitutional: no fever chills diaphoresis or fatigue or change in weight.  Head and neck: no hearing loss, no epistaxis, no photophobia or visual disturbance. Respiratory: No cough, shortness of breath or wheezing. Cardiovascular: No chest pain peripheral edema, palpitations. Gastrointestinal: No abdominal distention, no abdominal pain, no change in bowel habits hematochezia or melena. Genitourinary: No dysuria, no frequency, no urgency, no nocturia.  Musculoskeletal:No arthralgias, no back pain, no gait disturbance or myalgias. Neurological: No dizziness, no headaches, no numbness, no seizures, no syncope, no weakness, no tremors. Hematologic: No lymphadenopathy, no easy bruising. Psychiatric: No confusion, no hallucinations, no sleep disturbance.    Physical Exam: Filed Vitals:   07/24/12 1340  BP: 167/100  Pulse: 77   the general appearance reveals a pleasant elderly gentleman who  appears older than his stated age.Pupils equal and reactive.   Extraocular Movements are full.  There is no scleral icterus.  The mouth and pharynx are normal.  The neck is supple.  The carotids reveal no bruits.  The jugular venous pressure is normal.  The thyroid is not enlarged.  There is no lymphadenopathy.  Chest is clear. The heart reveals grade 2/6 systolic ejection murmur at the base. The abdomen is soft and nontender. Bowel sounds are normal. The liver and spleen are not enlarged. There Are no abdominal masses. There are no bruits.  Extremities show no significant edema   Assessment / Plan: We are going to arrange for an update of his ischemic workup with a Leksell scan Myoview.  Following that we will consider referral to EP for consideration of possible ICD plus minus cardiac resynchronization therapy.

## 2012-07-24 NOTE — Assessment & Plan Note (Signed)
Most recent echocardiogram shows ejection fraction has decreased down to 30-35%

## 2012-07-24 NOTE — Assessment & Plan Note (Signed)
The patient has a history of a left bundle branch block.  He has not been having any syncopal episodes

## 2012-07-24 NOTE — Patient Instructions (Signed)
Your physician has requested that you have a lexiscan myoview. For further information please visit [email or web address]. Please follow instruction sheet, as given.  Your physician recommends that you continue on your current medications as directed. Please refer to the Current Medication list given to you today.  

## 2012-08-03 ENCOUNTER — Ambulatory Visit (HOSPITAL_COMMUNITY): Payer: Medicare Other | Attending: Cardiology | Admitting: Radiology

## 2012-08-03 VITALS — BP 142/81 | Ht 72.0 in | Wt 214.0 lb

## 2012-08-03 DIAGNOSIS — R000 Tachycardia, unspecified: Secondary | ICD-10-CM | POA: Insufficient documentation

## 2012-08-03 DIAGNOSIS — R0789 Other chest pain: Secondary | ICD-10-CM | POA: Insufficient documentation

## 2012-08-03 DIAGNOSIS — R0609 Other forms of dyspnea: Secondary | ICD-10-CM | POA: Insufficient documentation

## 2012-08-03 DIAGNOSIS — I447 Left bundle-branch block, unspecified: Secondary | ICD-10-CM

## 2012-08-03 DIAGNOSIS — I10 Essential (primary) hypertension: Secondary | ICD-10-CM | POA: Insufficient documentation

## 2012-08-03 DIAGNOSIS — R0989 Other specified symptoms and signs involving the circulatory and respiratory systems: Secondary | ICD-10-CM | POA: Insufficient documentation

## 2012-08-03 DIAGNOSIS — I25810 Atherosclerosis of coronary artery bypass graft(s) without angina pectoris: Secondary | ICD-10-CM

## 2012-08-03 DIAGNOSIS — R0602 Shortness of breath: Secondary | ICD-10-CM

## 2012-08-03 DIAGNOSIS — R002 Palpitations: Secondary | ICD-10-CM | POA: Insufficient documentation

## 2012-08-03 MED ORDER — TECHNETIUM TC 99M SESTAMIBI GENERIC - CARDIOLITE
30.0000 | Freq: Once | INTRAVENOUS | Status: AC | PRN
  Administered 2012-08-03: 30 via INTRAVENOUS

## 2012-08-03 MED ORDER — ADENOSINE (DIAGNOSTIC) 3 MG/ML IV SOLN
0.5600 mg/kg | Freq: Once | INTRAVENOUS | Status: AC
  Administered 2012-08-03: 54.3 mg via INTRAVENOUS

## 2012-08-03 MED ORDER — TECHNETIUM TC 99M SESTAMIBI GENERIC - CARDIOLITE
10.0000 | Freq: Once | INTRAVENOUS | Status: AC | PRN
  Administered 2012-08-03: 10 via INTRAVENOUS

## 2012-08-03 NOTE — Progress Notes (Signed)
[facility] SITE 3 NUCLEAR MED [address] [id number] [place] [address] [phone]  Cardiology Nuclear Med Study  Corey Mccullough is a 77 y.o. male     MRN : [id number]     DOB: 1935-04-22  Procedure Date: 08/03/2012  Nuclear Med Background Indication for Stress Test:  Evaluation for Ischemia History:  2008 MPS: EF: 47% (-) ischemia  old inferior infarct 33/13 AAA 3.9 cm 07/11/12 EF: 30-35% mod to severe AS mild LVH Cardiac Risk Factors: History of Smoking, Hypertension, LBBB and Lipids  Symptoms:  Chest Tightness, DOE, Palpitations and Rapid HR   Nuclear Pre-Procedure Caffeine/Decaff Intake:  None NPO After: 8:00pm   Lungs:  clear O2 Sat: 95% on room air. IV 0.9% NS with Angio Cath:  22g  IV Site: R Forearm  IV Started by:  Stanton Kidney, EMT-P  Chest Size (in):  44 Cup Size: n/a  Height: 6' (1.829 m)  Weight:  214 lb (97.07 kg)  BMI:  Body mass index is 29.02 kg/(m^2). Tech Comments:  Meds were taken this am, per patient.    Nuclear Med Study 1 or 2 day study: 1 day  Stress Test Type:  Adenosine  Reading MD: Marca Ancona, MD  Order Authorizing Provider:  T.Brackbill MD  Resting Radionuclide: Technetium 25m Sestamibi  Resting Radionuclide Dose: 11.0 mCi   Stress Radionuclide:  Technetium 51m Sestamibi  Stress Radionuclide Dose: 33.0 mCi           Stress Protocol Rest HR: 78 Stress HR: 78  Rest BP: 142/81 Stress BP: 128/98  Exercise Time (min): n/a METS: n/a   Predicted Max HR: 145 bpm % Max HR: 53.79 bpm Rate Pressure Product: 47425   Dose of Adenosine (mg):  54.5 Dose of Lexiscan: n/a mg  Dose of Atropine (mg): n/a Dose of Dobutamine: n/a mcg/kg/min (at max HR)  Stress Test Technologist: Milana Na, EMT-P  Nuclear Technologist:  Domenic Polite, CNMT     Rest Procedure:  Myocardial perfusion imaging was performed at rest 45 minutes following the intravenous administration of Technetium 21m Sestamibi. Rest ECG: NSR-LBBB  Stress  Procedure:  The patient received IV adenosine at 140 mcg/kg/min for 4 minutes.  There were non Dx 2nd to LBBB with infusion.  Technetium 76m Sestamibi was injected at the 2 minute mark and quantitative spect images were obtained after a 45 minute delay. Stress ECG: No significant change from baseline ECG  QPS Raw Data Images:  Normal; no motion artifact; normal heart/lung ratio. Stress Images:  Medium, severe basal to mid inferior perfusion defect.  Rest Images:  Medium, severe basal to mid inferior perfusion defect.  Subtraction (SDS):  Fixed, medium severe basal to mid inferior perfusion defect.  Transient Ischemic Dilatation (Normal <1.22):  1.11 Lung/Heart Ratio (Normal <0.45):  0.29  Quantitative Gated Spect Images QGS EDV:  138 ml QGS ESV:  92 ml  Impression Exercise Capacity:  Adenosine study with no exercise. BP Response:  Normal blood pressure response. Clinical Symptoms:  Warm, flushed ECG Impression:  Baseline:  LBBB.  EKG uninterpretable due to LBBB at rest and stress. Comparison with Prior Nuclear Study: No significant change from previous study  Overall Impression:  Moderate risk stress test.  There is a medium, severe fixed basal to mid inferior perfusion defect suggestive of prior infarction.  EF is moderately depressed with inferior perfusion defect.    LV Ejection Fraction: 33%.  LV Wall Motion:  Severe inferior hypokinesis.   Marca Ancona 08/03/2012

## 2012-08-04 ENCOUNTER — Telehealth: Payer: Self-pay | Admitting: *Deleted

## 2012-08-04 DIAGNOSIS — R9430 Abnormal result of cardiovascular function study, unspecified: Secondary | ICD-10-CM

## 2012-08-04 NOTE — Telephone Encounter (Signed)
Message copied by Burnell Blanks on Fri Aug 04, 2012  4:58 PM ------      Message from: Cassell Clement      Created: Fri Aug 04, 2012  3:25 PM       Please report.  The nuclear perfusion scan shows an ejection fraction of 33% which is similar to the results from the previous scans.  There is evidence of a previous old inferior wall scar and there is no evidence of any reversible ischemia.  Please refer him to EP for consideration for possible ICD placement and possible resynchronization therapy to improve his symptomatic dyspnea, as per my previous office visit note.

## 2012-08-04 NOTE — Telephone Encounter (Signed)
Advised patient and sent to scheduling for appointment

## 2012-08-08 NOTE — Telephone Encounter (Signed)
Patient is scheduled to see Dr Graciela Husbands in December

## 2012-09-08 ENCOUNTER — Encounter: Payer: Self-pay | Admitting: Internal Medicine

## 2012-09-08 ENCOUNTER — Ambulatory Visit (INDEPENDENT_AMBULATORY_CARE_PROVIDER_SITE_OTHER): Payer: Medicare Other | Admitting: Internal Medicine

## 2012-09-08 VITALS — BP 142/80 | HR 80 | Ht 72.0 in | Wt 215.0 lb

## 2012-09-08 DIAGNOSIS — I5189 Other ill-defined heart diseases: Secondary | ICD-10-CM

## 2012-09-08 DIAGNOSIS — I350 Nonrheumatic aortic (valve) stenosis: Secondary | ICD-10-CM

## 2012-09-08 DIAGNOSIS — I519 Heart disease, unspecified: Secondary | ICD-10-CM

## 2012-09-08 DIAGNOSIS — R002 Palpitations: Secondary | ICD-10-CM

## 2012-09-08 DIAGNOSIS — I255 Ischemic cardiomyopathy: Secondary | ICD-10-CM | POA: Insufficient documentation

## 2012-09-08 DIAGNOSIS — I4891 Unspecified atrial fibrillation: Secondary | ICD-10-CM | POA: Insufficient documentation

## 2012-09-08 DIAGNOSIS — I5042 Chronic combined systolic (congestive) and diastolic (congestive) heart failure: Secondary | ICD-10-CM

## 2012-09-08 DIAGNOSIS — I509 Heart failure, unspecified: Secondary | ICD-10-CM

## 2012-09-08 DIAGNOSIS — I447 Left bundle-branch block, unspecified: Secondary | ICD-10-CM

## 2012-09-08 DIAGNOSIS — I359 Nonrheumatic aortic valve disorder, unspecified: Secondary | ICD-10-CM

## 2012-09-08 MED ORDER — ASPIRIN 81 MG PO TABS: 81.0000 mg | ORAL_TABLET | Freq: Every day | ORAL | Status: AC

## 2012-09-08 NOTE — Progress Notes (Signed)
ELECTROPHYSIOLOGY CONSULT NOTE  Patient ID: Corey Mccullough, MRN: [id number], DOB/AGE: Jul 31, 1935 77 y.o. Admit date: (Not on file) Date of Consult: 09/08/2012  Primary Physician: Pcp Not In System Primary Cardiologist: TB  Chief Complaint: ICD   HPI Corey Mccullough is a 77 y.o. male   see at the request of Dr. Patty Sermons for consideration of ICD implantation.  He has a history of aortic valve disease with moderate to severe stenosis. He has ischemic heart disease with prior IMI. Most recent ejection fraction is 30-35%. The aortic valve gradient was 27 and peak gradient was 47.   He also has left bundle branch block. He has been a long-term beta blockers and ACE inhibitors as well as calcium blocker for palpitations   Recurrent tachypalpitations which are abrupt in onset and offset are somewhat irregular. He has a prior history of stroke and so if this represents atrial fibrillation he has a high CHADS-VASc score.  He has moderate exercise intolerance with dyspnea and hip pain both contributing. He denies chest pain.    Past Medical History  Diagnosis Date  . Hypertension   . Hypertensive cardiovascular disease   . Left ventricular systolic dysfunction   . Left ventricular diastolic dysfunction   . Left bundle branch block   . Hypercholesteremia   . History of cardiovascular stress test     EF - 35-40%  . GERD (gastroesophageal reflux disease)       Surgical History:  Past Surgical History  Procedure Date  . US echocardiography 10/29/2009    EF 35-40%  . US echocardiography 04/28/2009    EF 40%  . Cardiovascular stress test 01/26/2007    EF 47%     Home Meds: Prior to Admission medications   Medication Sig Start Date End Date Taking? Authorizing Provider  aspirin 325 MG tablet Take 325 mg by mouth daily.     Yes Historical Provider, MD  atorvastatin (LIPITOR) 80 MG tablet Take 80 mg by mouth daily.     Yes Historical Provider, MD  carvedilol (COREG) 12.5 MG tablet TAKE ONE  TABLET BY MOUTH TWICE DAILY 08/12/11  Yes Cassell Clement, MD  diltiazem (CARDIZEM) 30 MG tablet TAKE ONE TABLET BY MOUTH EVERY 6 HOURS AS NEEDED FOR PALPITATION 07/07/12  Yes Cassell Clement, MD  losartan-hydrochlorothiazide [facility]) 100-25 MG per tablet 1/2 tablet (2) two times daily 12/23/11  Yes Cassell Clement, MD  omeprazole (PRILOSEC) 20 MG capsule Take 20 mg by mouth daily.     Yes Historical Provider, MD      Allergies: No Known Allergies  History   Social History  . Marital Status: Married    Spouse Name: N/A    Number of Children: N/A  . Years of Education: N/A   Occupational History  . Not on file.   Social History Main Topics  . Smoking status: Former Smoker -- 1.0 packs/day for 50 years    Types: Cigarettes    Quit date: 03/01/2006  . Smokeless tobacco: Never Used  . Alcohol Use: No  . Drug Use: No  . Sexually Active: Not on file   Other Topics Concern  . Not on file   Social History Narrative  . No narrative on file     Family History  Problem Relation Age of Onset  . Adopted: Yes     ROS:  Please see the history of present illness.     All other systems reviewed and negative.    Physical Exam:  Blood pressure 142/80,  pulse 80, height 6' (1.829 m), weight 215 lb (97.523 kg). General: Well developed, well nourished male in no acute distress. Head: Normocephalic, atraumatic, sclera non-icteric, no xanthomas, nares are without discharge. Lymph Nodes:  none Back: without scoliosis/kyphosis , no CVA tendersness Neck: Negative for carotid bruits carotids are delayed. JVD 9-10 cm  Lungs: Clear bilaterally to auscultation without wheezes, rales, or rhonchi. Breathing is unlabored. Heart: RRR with S1 S2.  3/6 systolic ejection murmur , rubs, or gallops appreciated. Abdomen: Soft, non-tender, non-distended with normoactive bowel sounds. No hepatomegaly. No rebound/guarding. No obvious abdominal masses. Msk:  Strength and tone appear normal for  age. Extremities: No clubbing or cyanosis. No edema.  Distal pedal pulses are 2+ and equal bilaterally. Skin: Warm and Dry Neuro: Alert and oriented X 3. CN III-XII intact Grossly normal sensory and motor function . Psych:  Responds to questions appropriately with a normal affect.      Labs: Cardiac Enzymes No results found for this basename: CKTOTAL:4,CKMB:4,TROPONINI:4 in the last 72 hours CBC Lab Results  Component Value Date   WBC 5.6 04/13/2008   HGB 13.9 04/13/2008   HCT 40.4 04/13/2008   MCV 95.5 04/13/2008   PLT 206 04/13/2008   PROTIME: No results found for this basename: LABPROT:3,INR:3 in the last 72 hours Chemistry No results found for this basename: NA,K,CL,CO2,BUN,CREATININE,CALCIUM,LABALBU,PROT,BILITOT,ALKPHOS,ALT,AST,GLUCOSE in the last 168 hours Lipids Lab Results  Component Value Date   CHOL 184 07/04/2012   HDL 58.60 07/04/2012   LDLCALC 96 07/04/2012   TRIG 148.0 07/04/2012   BNP Pro B Natriuretic peptide (BNP)  Date/Time Value Range Status  04/14/2008 12:22 AM 160.0*  Final   Miscellaneous No results found for this basename: DDIMER    Radiology/Studies:  No results found.  EKG:  From March of sinus rhythm with left bundle branch block and first degree AV block Intervals 27/13/43   Assessment and Plan:   Sherryl Manges

## 2012-09-08 NOTE — Assessment & Plan Note (Signed)
As above.

## 2012-09-08 NOTE — Assessment & Plan Note (Signed)
The patient has a nonischemic cardiomyopathy in part related to his valve disease as well as hypertensive heart disease and ischemic disease with prior MI. His ejection fraction is depressed her beta blockers and ACE inhibitors. The issue of his aortic valve and its contribution to LV dysfunction he is to be evaluated as noted above. In addition, following the above, consideration of further afterload reduction may make sense.

## 2012-09-08 NOTE — Assessment & Plan Note (Signed)
The patient has perhaps a severe aortic stenosis with moderate-severe left ear and depressed LV function noted on his most recent echo. Given his symptoms of exercise intolerance, prior to consideration of CRT D. implantation it would be appropriate to evaluate his aortic valve and see whether therapy of the aortic valve would ameliorate his symptoms and result in improved left ventricular function. This might obviate the need for the device although he does have first degree AV block and left bundle branch block and we'll need to follow this issue closely. I have reviewed the above with Dr. Excell Seltzer. We'll undertake a dobutamine echo and he will see the patient after that. I will await conclusions that he and Dr. Patty Sermons put together

## 2012-09-08 NOTE — Progress Notes (Signed)
Patient ID: Corey Mccullough, male   DOB: [birth year], 77 y.o.   MRN: [id number] Enrolled patient with [employer] so a 30day event monitor can be mailed to his home

## 2012-09-08 NOTE — Assessment & Plan Note (Signed)
The patient has palpitations and a prior history of stroke. Given his age and structural heart disease, the concern I have is atrial fibrillation and will undertake an event recorder to try to elucidate that is it would inform alternative anticoagulants oppose his current regime

## 2012-09-08 NOTE — Patient Instructions (Signed)
Your physician recommends that you schedule a follow-up appointment in: 5 WEEKS WITH DR Graciela Husbands  Your physician has requested that you have a dobutamine echocardiogram. For further information please visit [email or web address] Please follow instruction sheet as given.   REFERRAL TO DR Excell Seltzer AFTER ECHO FOR AS  Your physician has recommended that you wear an event monitor. Event monitors are medical devices that record the heart's electrical activity. Doctors most often [place] these monitors to diagnose arrhythmias. Arrhythmias are problems with the speed or rhythm of the heartbeat. The monitor is a small, portable device. You can wear one while you do your normal daily activities. This is usually used to diagnose what is causing palpitations/syncope (passing out). AUTO DETECT FOR ATRIAL FIB  DECREASE ASPIRIN TO 81 MG ONCE DAILY

## 2012-09-14 ENCOUNTER — Other Ambulatory Visit (HOSPITAL_COMMUNITY): Payer: Medicare Other

## 2012-09-19 ENCOUNTER — Other Ambulatory Visit: Payer: Self-pay | Admitting: *Deleted

## 2012-09-19 DIAGNOSIS — I359 Nonrheumatic aortic valve disorder, unspecified: Secondary | ICD-10-CM

## 2012-09-19 DIAGNOSIS — I4891 Unspecified atrial fibrillation: Secondary | ICD-10-CM

## 2012-09-20 ENCOUNTER — Encounter (INDEPENDENT_AMBULATORY_CARE_PROVIDER_SITE_OTHER): Payer: Medicare Other

## 2012-09-20 DIAGNOSIS — I4891 Unspecified atrial fibrillation: Secondary | ICD-10-CM

## 2012-09-22 ENCOUNTER — Ambulatory Visit (HOSPITAL_COMMUNITY): Payer: Medicare Other

## 2012-09-22 ENCOUNTER — Other Ambulatory Visit: Payer: Self-pay | Admitting: *Deleted

## 2012-09-22 DIAGNOSIS — I10 Essential (primary) hypertension: Secondary | ICD-10-CM

## 2012-09-22 MED ORDER — AMLODIPINE BESYLATE 5 MG PO TABS: ORAL_TABLET | ORAL | Status: DC

## 2012-10-11 ENCOUNTER — Other Ambulatory Visit (HOSPITAL_COMMUNITY): Payer: Self-pay | Admitting: Internal Medicine

## 2012-10-11 DIAGNOSIS — I359 Nonrheumatic aortic valve disorder, unspecified: Secondary | ICD-10-CM

## 2012-10-13 ENCOUNTER — Ambulatory Visit (HOSPITAL_COMMUNITY): Payer: Medicare Other | Attending: Internal Medicine

## 2012-10-13 ENCOUNTER — Encounter: Payer: Self-pay | Admitting: Internal Medicine

## 2012-10-13 ENCOUNTER — Other Ambulatory Visit (HOSPITAL_COMMUNITY): Payer: Self-pay | Admitting: *Deleted

## 2012-10-13 DIAGNOSIS — I4891 Unspecified atrial fibrillation: Secondary | ICD-10-CM | POA: Insufficient documentation

## 2012-10-13 DIAGNOSIS — R0602 Shortness of breath: Secondary | ICD-10-CM

## 2012-10-13 DIAGNOSIS — I359 Nonrheumatic aortic valve disorder, unspecified: Secondary | ICD-10-CM

## 2012-10-13 DIAGNOSIS — I509 Heart failure, unspecified: Secondary | ICD-10-CM | POA: Insufficient documentation

## 2012-10-13 DIAGNOSIS — I447 Left bundle-branch block, unspecified: Secondary | ICD-10-CM | POA: Insufficient documentation

## 2012-10-13 DIAGNOSIS — R002 Palpitations: Secondary | ICD-10-CM | POA: Insufficient documentation

## 2012-10-13 MED ORDER — SODIUM CHLORIDE 0.9 % IV SOLN
20.0000 ug/kg | Freq: Once | INTRAVENOUS | Status: AC
  Administered 2012-10-13: 20 ug/kg/min via INTRAVENOUS

## 2012-10-13 NOTE — Progress Notes (Signed)
Echocardiogram performed.  

## 2012-10-27 ENCOUNTER — Ambulatory Visit (INDEPENDENT_AMBULATORY_CARE_PROVIDER_SITE_OTHER): Payer: Medicare Other | Admitting: Cardiovascular Disease

## 2012-10-27 ENCOUNTER — Encounter: Payer: Self-pay | Admitting: Cardiovascular Disease

## 2012-10-27 VITALS — BP 146/95 | HR 75 | Ht 72.0 in | Wt 214.0 lb

## 2012-10-27 DIAGNOSIS — I119 Hypertensive heart disease without heart failure: Secondary | ICD-10-CM

## 2012-10-27 DIAGNOSIS — I350 Nonrheumatic aortic (valve) stenosis: Secondary | ICD-10-CM

## 2012-10-27 DIAGNOSIS — I359 Nonrheumatic aortic valve disorder, unspecified: Secondary | ICD-10-CM

## 2012-10-27 MED ORDER — DILTIAZEM HCL 30 MG PO TABS: ORAL_TABLET | ORAL | Status: DC

## 2012-10-27 MED ORDER — LOSARTAN POTASSIUM-HCTZ 100-25 MG PO TABS: ORAL_TABLET | ORAL | Status: DC

## 2012-10-27 MED ORDER — CARVEDILOL 12.5 MG PO TABS: 12.5000 mg | ORAL_TABLET | Freq: Two times a day (BID) | ORAL | Status: DC

## 2012-10-27 MED ORDER — ATORVASTATIN CALCIUM 80 MG PO TABS: 80.0000 mg | ORAL_TABLET | Freq: Every day | ORAL | Status: DC

## 2012-10-27 NOTE — Patient Instructions (Addendum)
Your physician wants you to follow-up in: ONE YEAR WITH DR Theodoro Parma will receive a reminder letter in the mail two months in advance. If you don't receive a letter, please call our office to schedule the follow-up appointment.   Your physician has requested that you have an echocardiogram. Echocardiography is a painless test that uses sound waves to create images of your heart. It provides your doctor with information about the size and shape of your heart and how well your heart's chambers and valves are working. This procedure takes approximately one hour. There are no restrictions for this procedure. SCHEDULE IN 6 MONTHS

## 2012-10-27 NOTE — Progress Notes (Signed)
HPI:  78 year old gentleman referred by Dr. Graciela Husbands for evaluation of aortic stenosis. The patient has been followed for cardiomyopathy, coronary disease with prior inferior wall myocardial infarction, and abdominal aortic aneurysm. He is being considered for an ICD for primary prevention of sudden cardiac death in the setting of his ischemic cardiomyopathy.  A recent echocardiogram in October 2013 showed severe LV dysfunction with an ejection fraction of 30-35%. The patient's peak and mean transaortic valve gradients were 43 and 27 mm mercury, respectively. This was followed with a dobutamine echocardiogram demonstrating augmentation of the patient's estimated stroke volume at peak dobutamine with no significant change in his mean gradient. The mean transaortic valve gradient went from 24-28 mm mercury. Aortic valve area went from 1.2-1.4 cm.  From a symptomatic perspective, the patient complains of exertional shortness of breath. This is been slowly progressive. He denies chest pain or tightness. He denies exertional lightheadedness or syncope. He's had no palpitations.  Outpatient Encounter Prescriptions as of 10/27/2012  Medication Sig Dispense Refill  . aspirin 81 MG tablet Take 1 tablet (81 mg total) by mouth daily.      Marland Kitchen atorvastatin (LIPITOR) 80 MG tablet Take 1 tablet (80 mg total) by mouth daily.  90 tablet  4  . carvedilol (COREG) 12.5 MG tablet Take 1 tablet (12.5 mg total) by mouth 2 (two) times daily with a meal.  180 tablet  3  . diltiazem (CARDIZEM) 30 MG tablet ONE TABLET EVERY SIX HOURS AS NEEDED FOR PALPITATIONS  30 tablet  12  . losartan-hydrochlorothiazide (HYZAAR) 100-25 MG per tablet 1/2 tablet (2) two times daily  90 tablet  3  . omeprazole (PRILOSEC) 20 MG capsule Take 20 mg by mouth daily.        . [DISCONTINUED] atorvastatin (LIPITOR) 80 MG tablet Take 80 mg by mouth daily.        . [DISCONTINUED] carvedilol (COREG) 12.5 MG tablet TAKE ONE TABLET BY MOUTH TWICE DAILY   180 tablet  3  . [DISCONTINUED] diltiazem (CARDIZEM) 30 MG tablet TAKE ONE TABLET BY MOUTH EVERY 6 HOURS AS NEEDED FOR PALPITATION  30 tablet  5  . [DISCONTINUED] losartan-hydrochlorothiazide (HYZAAR) 100-25 MG per tablet 1/2 tablet (2) two times daily  90 tablet  3  . [DISCONTINUED] amLODipine (NORVASC) 5 MG tablet TAKE ONE TABLET THE MORNING BEFORE AND THE MORNING OF PROCEDURE  2 tablet  0    Review of patient's allergies indicates no known allergies.  Past Medical History  Diagnosis Date  . Hypertension   . Chronic systolic heart failure   . Left ventricular systolic dysfunction   . Aortic stenosis     Moderate-severe 11/13 mean gradient 27 EF 30-35%  . Left bundle branch block   . Hypercholesteremia   . GERD (gastroesophageal reflux disease)     No past surgical history on file.  History   Social History  . Marital Status: Married    Spouse Name: N/A    Number of Children: N/A  . Years of Education: N/A   Occupational History  . Not on file.   Social History Main Topics  . Smoking status: Former Smoker -- 1.0 packs/day for 50 years    Types: Cigarettes    Quit date: 03/01/2006  . Smokeless tobacco: Never Used  . Alcohol Use: No  . Drug Use: No  . Sexually Active: Not on file   Other Topics Concern  . Not on file   Social History Narrative  . No narrative on  file    Family History  Problem Relation Age of Onset  . Adopted: Yes    ROS: General: no fevers/chills/night sweats Eyes: no blurry vision, diplopia, or amaurosis ENT: no sore throat or hearing loss Resp: no cough, wheezing, or hemoptysis CV: no edema or palpitations GI: no abdominal pain, nausea, vomiting, diarrhea, or constipation GU: no dysuria, frequency, or hematuria Skin: no rash Neuro: no headache, numbness, tingling, or weakness of extremities Musculoskeletal: Positive for hip pain. Heme: no bleeding, DVT, or easy bruising Endo: no polydipsia or polyuria  BP 146/95  Pulse 75  Ht 6'  (1.829 m)  Wt 97.07 kg (214 lb)  BMI 29.02 kg/m2  PHYSICAL EXAM: Pt is alert and oriented, WD, WN, pleasant elderly man in in no distress. HEENT: normal Neck: JVP normal. Carotid upstrokes normal with bilateral bruits. No thyromegaly. Lungs: equal expansion, clear bilaterally CV: Apex is discrete and nondisplaced, RRR with grade 2/6 harsh systolic murmur at the left lower sternal border and apex with diminished A2  Abd: soft, NT, +BS, no bruit, no hepatosplenomegaly Back: no CVA tenderness Ext: no C/C/E Skin: warm and dry without rash Neuro: CNII-XII intact             Strength intact = bilaterally  EKG:  Normal sinus rhythm 81 beats per minute, incomplete left bundle branch block.  2-D echo: Left ventricle: The cavity size was normal. Wall thickness was increased in a pattern of mild LVH. There was mild focal basal hypertrophy of the septum. Systolic function was moderately to severely reduced. The estimated ejection fraction was in the range of 30% to 35%. Diffuse hypokinesis. Regional wall motion abnormalities: There is akinesis of the inferior myocardium. Doppler parameters are consistent with abnormal left ventricular relaxation (grade 1 diastolic dysfunction).  ------------------------------------------------------------ Aortic valve: Trileaflet; severely calcified leaflets. Cusp separation was reduced. Doppler: There was moderate to severe stenosis. Mild regurgitation. VTI ratio of LVOT to aortic valve: 0.2. Indexed valve area: 0.45cm^2/m^2 (VTI). Peak velocity ratio of LVOT to aortic valve: 0.23. Indexed valve area: 0.5cm^2/m^2 (Vmax). Mean gradient: 27mm Hg (S). Peak gradient: 47mm Hg (S).  ------------------------------------------------------------ Aorta: Aortic root: The aortic root was normal in size.  ------------------------------------------------------------ Mitral valve: Calcified annulus. Leaflet separation was normal. Doppler: Transvalvular velocity was  within the normal range. There was no evidence for stenosis. Mild regurgitation.  ------------------------------------------------------------ Left atrium: The atrium was mildly dilated.  ------------------------------------------------------------ Right ventricle: The cavity size was normal. Systolic function was reduced.  ------------------------------------------------------------ Pulmonic valve: Structurally normal valve. Cusp separation was normal. Doppler: Transvalvular velocity was within the normal range. Trivial regurgitation.  ------------------------------------------------------------ Tricuspid valve: Structurally normal valve. Leaflet separation was normal. Doppler: Transvalvular velocity was within the normal range. Mild regurgitation.  ------------------------------------------------------------ Right atrium: The atrium was normal in size.  ------------------------------------------------------------ Pericardium: There was no pericardial effusion.  ------------------------------------------------------------ Systemic veins: Inferior vena cava: The vessel was normal in size; the respirophasic diameter changes were in the normal range (= 50%); findings are consistent with normal central venous Pressure.  Dobutamine echocardiogram: Impressions: Only doppler data recorded.  Baseline: Peak gradient 40, mean 24, PKvel 3.14, AVA 1.26 Stroke Volume 103 cc Peak Dob: Peak gradient 50, mean 28, Pkvel 3.55, AVA 1.44, Stroke Volume 137  This would indicate reasonable augmentation of EF with dobutamine with increased gradients and stroke volume AVA increases with dobutamine indicating more mild to moderate AS and not low gradient severe AS from poor LV function  ASSESSMENT AND PLAN: This is a 78 year old gentleman aortic stenosis. I  have personally reviewed his echo images as well as is dobutamine echo hemodynamics. I think he has moderate aortic stenosis at this  point based on augmentation of LV function without significant change in transaortic valve gradients on dobutamine echo. His aortic valve certainly is calcified and stenotic and has morphologic appearance of aortic stenosis. However, I think he can be observed with continued outpatient followup at this time. He is going to see Dr. Graciela Husbands in the near future for consideration of an ICD. I have recommended a repeat echocardiogram in 6 months and if stable I think we can continue with yearly followup thereafter. We discussed potential symptoms that can be related to aortic stenosis and he understands. We also discussed the natural history of this disease and possible interventions in the future if he develops more severe aortic stenosis.  I will plan on seeing the patient back in one year for further evaluation and discussion. He will continue regular cardiac followup with Dr. Patty Sermons.  I appreciate the opportunity to see this nice gentleman.  Tonny Bollman 10/27/2012 5:36 PM

## 2012-11-02 ENCOUNTER — Ambulatory Visit (INDEPENDENT_AMBULATORY_CARE_PROVIDER_SITE_OTHER): Payer: Medicare Other | Admitting: Internal Medicine

## 2012-11-02 ENCOUNTER — Encounter: Payer: Self-pay | Admitting: Internal Medicine

## 2012-11-02 VITALS — BP 124/93 | HR 79 | Ht 72.0 in | Wt 214.8 lb

## 2012-11-02 DIAGNOSIS — I519 Heart disease, unspecified: Secondary | ICD-10-CM

## 2012-11-02 DIAGNOSIS — I509 Heart failure, unspecified: Secondary | ICD-10-CM

## 2012-11-02 DIAGNOSIS — I5042 Chronic combined systolic (congestive) and diastolic (congestive) heart failure: Secondary | ICD-10-CM

## 2012-11-02 DIAGNOSIS — I4891 Unspecified atrial fibrillation: Secondary | ICD-10-CM

## 2012-11-02 DIAGNOSIS — I447 Left bundle-branch block, unspecified: Secondary | ICD-10-CM

## 2012-11-02 DIAGNOSIS — I5189 Other ill-defined heart diseases: Secondary | ICD-10-CM

## 2012-11-02 NOTE — Assessment & Plan Note (Signed)
The patient is on guideline directed medical therapy and has persistent left ventricular dysfunction. Evaluation regarding his aortic valve suggests that the aortic stenosis is moderate not severe. It will be followed. It is appropriate to consider ICD implantation.  Have reviewed the potential benefits and risks of ICD implantation including but not limited to death, perforation of heart or lung, lead dislodgement, infection,  device malfunction and inappropriate shocks.  The patient and family express understanding  and are willing to proceed.    Marland Kitchen

## 2012-11-02 NOTE — Assessment & Plan Note (Signed)
The patient's event recorder was confirming of a diagnosis of atrial fibrillation. With his prior history of TIA, his CHADS-VASc score is greater than or equal to 5. Anticoagulation was discussed. He is not enamored with the idea of anticoagulation. I will review this with Dr. Patty Sermons. Would anticipate starting anticoagulation following device implantation.  Asymptomatic palpitations are quite discombobulated. It would be appropriate also to consider amiodarone for suppression given the associated symptoms.

## 2012-11-02 NOTE — Assessment & Plan Note (Signed)
The patient has congestive heart failure. We have discussed the potential role of CRT adjunct to his ICD. His QRS is modestly prolonged . It is appropriate to consider LV lead placement .

## 2012-11-02 NOTE — Patient Instructions (Signed)
Your physician has recommended that you have a Bi- Ventricular defibrillator inserted. An implantable cardioverter defibrillator (ICD) is a small device that is placed in your chest or, in rare cases, your abdomen. This device uses electrical pulses or shocks to help control life-threatening, irregular heartbeats that could lead the heart to suddenly stop beating (sudden cardiac arrest). Leads are attached to the ICD that goes into your heart. This is done in the hospital and usually requires an overnight stay.   These are possible dates from mid to late March that Dr. Graciela Husbands would be available: - Monday 3/10 - Thursday 3/13 - Monday 3/17 - Wednesday 3/19 - Monday 3/24 - Thursday 3/27  When you decide which date you would like to proceed please call Herbert Seta, RN at [phone] to schedule.  Your physician recommends that you continue on your current medications as directed. Please refer to the Current Medication list given to you today.

## 2012-11-02 NOTE — Assessment & Plan Note (Signed)
As above.

## 2012-11-02 NOTE — Progress Notes (Signed)
.  kf Patient Care Team: Pcp Not In System as PCP - General   HPI  Corey Mccullough is a 78 y.o. male Found to have aortic stenosis at the time of request for ICD implantation. The question was whether he had low gradient severe aortic stenosis and he was seen in consultation by Dr. Excell Seltzer who after dobutamine echo felt that aortic stenosis with moderate. He is referred back now for consideration of ICD implantation. He also has known left bundle-branch block  Past Medical History  Diagnosis Date  . Hypertension   . Chronic systolic heart failure   . Left ventricular systolic dysfunction   . Aortic stenosis     Moderate-severe 11/13 mean gradient 27 EF 30-35%  . Left bundle branch block   . Hypercholesteremia   . GERD (gastroesophageal reflux disease)     No past surgical history on file.  Current Outpatient Prescriptions  Medication Sig Dispense Refill  . aspirin 81 MG tablet Take 1 tablet (81 mg total) by mouth daily.      Marland Kitchen atorvastatin (LIPITOR) 80 MG tablet Take 1 tablet (80 mg total) by mouth daily.  90 tablet  4  . carvedilol (COREG) 12.5 MG tablet Take 1 tablet (12.5 mg total) by mouth 2 (two) times daily with a meal.  180 tablet  3  . diltiazem (CARDIZEM) 30 MG tablet ONE TABLET EVERY SIX HOURS AS NEEDED FOR PALPITATIONS  30 tablet  12  . losartan-hydrochlorothiazide (HYZAAR) 100-25 MG per tablet 1/2 tablet (2) two times daily  90 tablet  3  . omeprazole (PRILOSEC) 20 MG capsule Take 20 mg by mouth daily.        . [DISCONTINUED] enalapril (VASOTEC) 20 MG tablet Take 1 tablet by mouth Twice daily.        No Known Allergies  Review of Systems negative except from HPI and PMH  Physical Exam BP 124/93  Pulse 79  Ht 6' (1.829 m)  Wt 214 lb 12.8 oz (97.433 kg)  BMI 29.13 kg/m2 Well developed and nourished in no acute distress HENT normal Neck supple with JVP-flat Clear Regular rate and rhythm, no murmurs or gallops Abd-soft with active BS No Clubbing cyanosis  edema Skin-warm and dry A & Oriented  Grossly normal sensory and motor function  ECG demonstrates left bundle  QRS 134  Assessment and  Plan

## 2012-11-07 ENCOUNTER — Telehealth: Payer: Self-pay | Admitting: Internal Medicine

## 2012-11-07 ENCOUNTER — Encounter: Payer: Self-pay | Admitting: *Deleted

## 2012-11-07 DIAGNOSIS — I509 Heart failure, unspecified: Secondary | ICD-10-CM

## 2012-11-07 NOTE — Telephone Encounter (Signed)
New Problem:    Patient called in wanting to confirm a date to have a Pacemaker procedure performed.  Please call back.

## 2012-11-07 NOTE — Telephone Encounter (Signed)
I spoke with the patient. He wants to have his Bi-V ICD implanted on 12/11/12. This has been scheduled for 7:30 am. Instructions given via phone.

## 2012-11-07 NOTE — Telephone Encounter (Signed)
Labs to be done on 3/4. Instructions also mailed to the patient.

## 2012-11-08 ENCOUNTER — Other Ambulatory Visit: Payer: Self-pay | Admitting: *Deleted

## 2012-11-08 DIAGNOSIS — I4891 Unspecified atrial fibrillation: Secondary | ICD-10-CM

## 2012-11-24 ENCOUNTER — Encounter (HOSPITAL_COMMUNITY): Payer: Self-pay | Admitting: Pharmacy Technician

## 2012-12-05 ENCOUNTER — Other Ambulatory Visit (INDEPENDENT_AMBULATORY_CARE_PROVIDER_SITE_OTHER): Payer: Medicare Other

## 2012-12-05 DIAGNOSIS — I350 Nonrheumatic aortic (valve) stenosis: Secondary | ICD-10-CM

## 2012-12-05 DIAGNOSIS — R0609 Other forms of dyspnea: Secondary | ICD-10-CM

## 2012-12-05 DIAGNOSIS — R0989 Other specified symptoms and signs involving the circulatory and respiratory systems: Secondary | ICD-10-CM

## 2012-12-05 DIAGNOSIS — E7800 Pure hypercholesterolemia, unspecified: Secondary | ICD-10-CM

## 2012-12-05 DIAGNOSIS — I359 Nonrheumatic aortic valve disorder, unspecified: Secondary | ICD-10-CM

## 2012-12-05 LAB — CBC WITH DIFFERENTIAL/PLATELET
Basophils Relative: 0.4 % (ref 0.0–3.0)
Eosinophils Relative: 2.6 % (ref 0.0–5.0)
HCT: 43.7 % (ref 39.0–52.0)
Lymphs Abs: 1.7 10*3/uL (ref 0.7–4.0)
MCV: 94.6 fl (ref 78.0–100.0)
Monocytes Relative: 12.6 % — ABNORMAL HIGH (ref 3.0–12.0)
Neutrophils Relative %: 51.9 % (ref 43.0–77.0)
Platelets: 185 10*3/uL (ref 150.0–400.0)
RBC: 4.62 Mil/uL (ref 4.22–5.81)
WBC: 5.1 10*3/uL (ref 4.5–10.5)

## 2012-12-05 LAB — HEPATIC FUNCTION PANEL
ALT: 14 U/L (ref 0–53)
AST: 20 U/L (ref 0–37)
Bilirubin, Direct: 0.1 mg/dL (ref 0.0–0.3)
Total Bilirubin: 0.7 mg/dL (ref 0.3–1.2)
Total Protein: 7.7 g/dL (ref 6.0–8.3)

## 2012-12-05 LAB — LDL CHOLESTEROL, DIRECT: Direct LDL: 132.1 mg/dL

## 2012-12-05 LAB — BASIC METABOLIC PANEL
BUN: 24 mg/dL — ABNORMAL HIGH (ref 6–23)
Chloride: 104 mEq/L (ref 96–112)
GFR: 59.71 mL/min — ABNORMAL LOW (ref >60.00)
Potassium: 4.3 mEq/L (ref 3.5–5.1)

## 2012-12-05 LAB — LIPID PANEL: Total CHOL/HDL Ratio: 5

## 2012-12-11 ENCOUNTER — Encounter (HOSPITAL_COMMUNITY)
Admission: RE | Disposition: A | Discharge status: Home / Self Care | Payer: Self-pay | Source: Ambulatory Visit | Attending: Internal Medicine

## 2012-12-11 ENCOUNTER — Ambulatory Visit (HOSPITAL_COMMUNITY)
Admission: RE | Admit: 2012-12-11 | Discharge: 2012-12-12 | Disposition: A | Discharge status: Home / Self Care | Payer: Medicare Other | Source: Ambulatory Visit | Attending: Internal Medicine | Admitting: Internal Medicine

## 2012-12-11 ENCOUNTER — Encounter (HOSPITAL_COMMUNITY): Payer: Self-pay | Admitting: Internal Medicine

## 2012-12-11 ENCOUNTER — Other Ambulatory Visit: Payer: Self-pay

## 2012-12-11 DIAGNOSIS — I714 Abdominal aortic aneurysm, without rupture: Secondary | ICD-10-CM

## 2012-12-11 DIAGNOSIS — I447 Left bundle-branch block, unspecified: Secondary | ICD-10-CM | POA: Diagnosis present

## 2012-12-11 DIAGNOSIS — E785 Hyperlipidemia, unspecified: Secondary | ICD-10-CM | POA: Insufficient documentation

## 2012-12-11 DIAGNOSIS — I4891 Unspecified atrial fibrillation: Secondary | ICD-10-CM

## 2012-12-11 DIAGNOSIS — K219 Gastro-esophageal reflux disease without esophagitis: Secondary | ICD-10-CM | POA: Insufficient documentation

## 2012-12-11 DIAGNOSIS — E7800 Pure hypercholesterolemia, unspecified: Secondary | ICD-10-CM

## 2012-12-11 DIAGNOSIS — Z95810 Presence of automatic (implantable) cardiac defibrillator: Secondary | ICD-10-CM

## 2012-12-11 DIAGNOSIS — I509 Heart failure, unspecified: Secondary | ICD-10-CM | POA: Insufficient documentation

## 2012-12-11 DIAGNOSIS — I255 Ischemic cardiomyopathy: Secondary | ICD-10-CM

## 2012-12-11 DIAGNOSIS — I5042 Chronic combined systolic (congestive) and diastolic (congestive) heart failure: Secondary | ICD-10-CM

## 2012-12-11 DIAGNOSIS — I10 Essential (primary) hypertension: Secondary | ICD-10-CM | POA: Insufficient documentation

## 2012-12-11 DIAGNOSIS — I2589 Other forms of chronic ischemic heart disease: Secondary | ICD-10-CM | POA: Insufficient documentation

## 2012-12-11 DIAGNOSIS — I350 Nonrheumatic aortic (valve) stenosis: Secondary | ICD-10-CM

## 2012-12-11 DIAGNOSIS — I359 Nonrheumatic aortic valve disorder, unspecified: Secondary | ICD-10-CM | POA: Insufficient documentation

## 2012-12-11 DIAGNOSIS — Z23 Encounter for immunization: Secondary | ICD-10-CM | POA: Insufficient documentation

## 2012-12-11 DIAGNOSIS — Z8673 Personal history of transient ischemic attack (TIA), and cerebral infarction without residual deficits: Secondary | ICD-10-CM | POA: Insufficient documentation

## 2012-12-11 DIAGNOSIS — I428 Other cardiomyopathies: Secondary | ICD-10-CM | POA: Insufficient documentation

## 2012-12-11 HISTORY — DX: Acute myocardial infarction, unspecified: I21.9

## 2012-12-11 HISTORY — DX: Presence of automatic (implantable) cardiac defibrillator: Z95.810

## 2012-12-11 HISTORY — DX: Heart failure, unspecified: I50.9

## 2012-12-11 HISTORY — DX: Cerebral infarction, unspecified: I63.9

## 2012-12-11 HISTORY — PX: OTHER SURGICAL HISTORY: SHX169

## 2012-12-11 HISTORY — PX: BI-VENTRICULAR IMPLANTABLE CARDIOVERTER DEFIBRILLATOR: SHX5459

## 2012-12-11 HISTORY — DX: Ischemic cardiomyopathy: I25.5

## 2012-12-11 HISTORY — DX: Unspecified osteoarthritis, unspecified site: M19.90

## 2012-12-11 HISTORY — DX: Shortness of breath: R06.02

## 2012-12-11 LAB — SURGICAL PCR SCREEN: Staphylococcus aureus: NEGATIVE

## 2012-12-11 SURGERY — BI-VENTRICULAR IMPLANTABLE CARDIOVERTER DEFIBRILLATOR  (CRT-D)
Anesthesia: LOCAL

## 2012-12-11 MED ORDER — LOSARTAN POTASSIUM 50 MG PO TABS
100.0000 mg | ORAL_TABLET | Freq: Every day | ORAL | Status: DC
  Administered 2012-12-12: 09:00:00 100 mg via ORAL
  Filled 2012-12-11 (×2): qty 2

## 2012-12-11 MED ORDER — SODIUM CHLORIDE 0.9 % IV SOLN
250.0000 mL | INTRAVENOUS | Status: DC
  Administered 2012-12-11: 07:00:00 via INTRAVENOUS

## 2012-12-11 MED ORDER — SODIUM CHLORIDE 0.9 % IV SOLN: INTRAVENOUS | Status: AC

## 2012-12-11 MED ORDER — CEFAZOLIN SODIUM-DEXTROSE 2-3 GM-% IV SOLR
2.0000 g | INTRAVENOUS | Status: DC
  Filled 2012-12-11: qty 50

## 2012-12-11 MED ORDER — ASPIRIN 81 MG PO CHEW
81.0000 mg | CHEWABLE_TABLET | Freq: Every day | ORAL | Status: DC
  Administered 2012-12-11 – 2012-12-12 (×2): 81 mg via ORAL
  Filled 2012-12-11 (×2): qty 1

## 2012-12-11 MED ORDER — CEFAZOLIN SODIUM 1-5 GM-% IV SOLN
1.0000 g | Freq: Four times a day (QID) | INTRAVENOUS | Status: AC
  Administered 2012-12-11 – 2012-12-12 (×3): 1 g via INTRAVENOUS
  Filled 2012-12-11 (×5): qty 50

## 2012-12-11 MED ORDER — SODIUM CHLORIDE 0.45 % IV SOLN
INTRAVENOUS | Status: DC
  Administered 2012-12-11: 07:00:00 via INTRAVENOUS

## 2012-12-11 MED ORDER — ACETAMINOPHEN 325 MG PO TABS
325.0000 mg | ORAL_TABLET | ORAL | Status: DC | PRN
  Administered 2012-12-11 – 2012-12-12 (×3): 650 mg via ORAL
  Filled 2012-12-11 (×3): qty 2

## 2012-12-11 MED ORDER — SODIUM CHLORIDE 0.9 % IR SOLN
80.0000 mg | Status: DC
  Filled 2012-12-11: qty 2

## 2012-12-11 MED ORDER — SODIUM CHLORIDE 0.9 % IJ SOLN: 3.0000 mL | INTRAMUSCULAR | Status: DC | PRN

## 2012-12-11 MED ORDER — PANTOPRAZOLE SODIUM 40 MG PO TBEC
40.0000 mg | DELAYED_RELEASE_TABLET | Freq: Every day | ORAL | Status: DC
  Administered 2012-12-11 – 2012-12-12 (×2): 40 mg via ORAL
  Filled 2012-12-11 (×2): qty 1

## 2012-12-11 MED ORDER — CARVEDILOL 25 MG PO TABS
25.0000 mg | ORAL_TABLET | Freq: Two times a day (BID) | ORAL | Status: DC
  Administered 2012-12-11 – 2012-12-12 (×2): 25 mg via ORAL
  Filled 2012-12-11 (×4): qty 1

## 2012-12-11 MED ORDER — SODIUM CHLORIDE 0.9 % IJ SOLN: 3.0000 mL | Freq: Two times a day (BID) | INTRAMUSCULAR | Status: DC

## 2012-12-11 MED ORDER — LABETALOL HCL 5 MG/ML IV SOLN
20.0000 mg | Freq: Once | INTRAVENOUS | Status: AC
  Administered 2012-12-11: 20 mg via INTRAVENOUS

## 2012-12-11 MED ORDER — FENTANYL CITRATE 0.05 MG/ML IJ SOLN
INTRAMUSCULAR | Status: AC
  Filled 2012-12-11: qty 2

## 2012-12-11 MED ORDER — YOU HAVE A PACEMAKER BOOK
Freq: Once | Status: DC
  Filled 2012-12-11: qty 1

## 2012-12-11 MED ORDER — MIDAZOLAM HCL 5 MG/5ML IJ SOLN
INTRAMUSCULAR | Status: AC
  Filled 2012-12-11: qty 5

## 2012-12-11 MED ORDER — MUPIROCIN 2 % EX OINT
TOPICAL_OINTMENT | CUTANEOUS | Status: AC
  Filled 2012-12-11: qty 22

## 2012-12-11 MED ORDER — CHLORHEXIDINE GLUCONATE 4 % EX LIQD: 60.0000 mL | Freq: Once | CUTANEOUS | Status: DC

## 2012-12-11 MED ORDER — LIDOCAINE HCL (PF) 1 % IJ SOLN
INTRAMUSCULAR | Status: AC
  Filled 2012-12-11: qty 30

## 2012-12-11 MED ORDER — LIDOCAINE HCL (PF) 1 % IJ SOLN
INTRAMUSCULAR | Status: AC
  Filled 2012-12-11: qty 60

## 2012-12-11 MED ORDER — HYDROCHLOROTHIAZIDE 25 MG PO TABS
25.0000 mg | ORAL_TABLET | Freq: Every day | ORAL | Status: DC
  Administered 2012-12-12: 09:00:00 25 mg via ORAL
  Filled 2012-12-11 (×2): qty 1

## 2012-12-11 MED ORDER — INFLUENZA VIRUS VACC SPLIT PF IM SUSP
0.5000 mL | INTRAMUSCULAR | Status: AC
  Administered 2012-12-12: 09:00:00 0.5 mL via INTRAMUSCULAR
  Filled 2012-12-11: qty 0.5

## 2012-12-11 MED ORDER — MUPIROCIN 2 % EX OINT
TOPICAL_OINTMENT | Freq: Two times a day (BID) | CUTANEOUS | Status: DC
  Administered 2012-12-11 (×2): via NASAL

## 2012-12-11 MED ORDER — LOSARTAN POTASSIUM-HCTZ 100-25 MG PO TABS: 1.0000 | ORAL_TABLET | Freq: Every day | ORAL | Status: DC

## 2012-12-11 MED ORDER — LABETALOL HCL 5 MG/ML IV SOLN
INTRAVENOUS | Status: AC
  Filled 2012-12-11: qty 4

## 2012-12-11 MED ORDER — HEPARIN (PORCINE) IN NACL 2-0.9 UNIT/ML-% IJ SOLN
INTRAMUSCULAR | Status: AC
  Filled 2012-12-11: qty 500

## 2012-12-11 MED ORDER — ONDANSETRON HCL 4 MG/2ML IJ SOLN: 4.0000 mg | Freq: Four times a day (QID) | INTRAMUSCULAR | Status: DC | PRN

## 2012-12-11 MED ORDER — ATORVASTATIN CALCIUM 80 MG PO TABS
80.0000 mg | ORAL_TABLET | Freq: Every day | ORAL | Status: DC
  Administered 2012-12-11: 80 mg via ORAL
  Filled 2012-12-11 (×2): qty 1

## 2012-12-11 NOTE — CV Procedure (Signed)
Corey Mccullough [id number]  [id number]  Preop Dx: NICM/ICm CHF class 2, LBBB Postop Dx same/   Procedure: dual chamber ICD implant with LV lead placement  Cx: None   Dictation number  [id number]  Sherryl Manges, MD 12/11/2012 9:56 AM

## 2012-12-11 NOTE — H&P (Addendum)
Patient Care Team: Pcp Not In System as PCP - General   HPI  Corey Mccullough is a 78 y.o. male Found to have aortic stenosis at the time of request for ICD implantation. The question was whether he had low gradient severe aortic stenosis and he was seen in consultation by Dr. Excell Seltzer who after dobutamine echo felt that aortic stenosis with moderate. He   has known left bundle-branch block w QRS duration of 134 He has a prior TIA and AF noted on event recorder with symptomatic palpitations He has moddest exercise intolerance with activity also limited by Hip pain>>class 2B  No intercurrent change in functional status   Past Medical History  Diagnosis Date  . Hypertension   . Chronic systolic heart failure   . Left ventricular systolic dysfunction   . Aortic stenosis     Moderate-severe 11/13 mean gradient 27 EF 30-35%  . Left bundle branch block   . Hypercholesteremia   . GERD (gastroesophageal reflux disease)     No past surgical history on file.  Current Facility-Administered Medications  Medication Dose Route Frequency Provider Last Rate Last Dose  . 0.45 % sodium chloride infusion   Intravenous Continuous Duke Salvia, MD 50 mL/hr at 12/11/12 [phone]    . 0.9 %  sodium chloride infusion  250 mL Intravenous Continuous Duke Salvia, MD 1 mL/hr at 12/11/12 [phone]    . ceFAZolin (ANCEF) IVPB 2 g/50 mL premix  2 g Intravenous On Call Duke Salvia, MD      . chlorhexidine (HIBICLENS) 4 % liquid 4 application  60 mL Topical Once Duke Salvia, MD      . gentamicin (GARAMYCIN) 80 mg in sodium chloride irrigation 0.9 % 500 mL irrigation  80 mg Irrigation On Call Duke Salvia, MD      . mupirocin ointment (BACTROBAN) 2 %   Nasal BID Duke Salvia, MD      . sodium chloride 0.9 % injection 3 mL  3 mL Intravenous Q12H Duke Salvia, MD      . sodium chloride 0.9 % injection 3 mL  3 mL Intravenous PRN Duke Salvia, MD        No Known Allergies  Review of Systems negative except from  HPI and PMH  Physical Exam BP 165/92  Pulse 76  Temp(Src) 97.1 F (36.2 C) (Oral)  Resp 18  Ht 6' (1.829 m)  Wt 215 lb (97.523 kg)  BMI 29.15 kg/m2  SpO2 97% Well developed and well nourished in no acute distress HENT normal E scleral and icterus clear Neck Supple JVP flat; carotids brisk and full Clear to ausculation  Regular rate and rhythm, 3/6 systolic murmur preserved S2 Soft with active bowel sounds No clubbing cyanosis none Edema Alert and oriented, grossly normal motor and sensory function Skin Warm and Dry [place] 2-3   Assessment and  Plan  Active Problems:   Left bundle branch block   Chronic combined systolic and diastolic congestive heart failure   Cardiomyopathy, ischemic and nonischemic  Pt for CRT-ICD implantation for primary prevention with LV lead placement for LBBB with C 2 CHF

## 2012-12-12 ENCOUNTER — Ambulatory Visit (HOSPITAL_COMMUNITY): Payer: Medicare Other

## 2012-12-12 MED ORDER — CARVEDILOL 12.5 MG PO TABS: 25.0000 mg | ORAL_TABLET | Freq: Two times a day (BID) | ORAL | Status: DC

## 2012-12-12 NOTE — Op Note (Signed)
NAME:  Corey Mccullough, Corey Mccullough                  ACCOUNT NO.:  [id number]  MEDICAL RECORD NO.:  [id number]  LOCATION:  6524                         FACILITY:  [facility]  PHYSICIAN:  Duke Salvia, MD, FACCDATE OF BIRTH:  04/15/1935  DATE OF PROCEDURE:  12/11/2012 DATE OF DISCHARGE:                              OPERATIVE REPORT   PREOPERATIVE DIAGNOSES:  Ischemic and nonischemic cardiomyopathy with left bundle-branch block, class II congestive heart failure, QRS duration of 135 milliseconds.  POSTOPERATIVE DIAGNOSES:  Ischemic and nonischemic cardiomyopathy with left bundle-branch block, class II congestive heart failure, QRS duration of 135 milliseconds.  PROCEDURE:  Dual-chamber defibrillator implantation with left ventricular lead placement.  DESCRIPTION OF PROCEDURE:  Following obtaining informed consent, the patient was brought to the electrophysiology laboratory and placed on the fluoroscopic table in supine position.  After routine prep and drape of the left upper chest, lidocaine was infiltrated in prepectoral subclavicular region.  Incision was made and carried down to layer of the prepectoral fascia using electrocautery and sharp dissection.  A pocket was formed similarly.  Hemostasis was obtained.  Thereafter, attention was turned to gain access to the extrathoracic left subclavian vein, which was accomplished without difficulty without the aspiration of air or puncture of the artery.  Two separate venipunctures were accomplished.  Guidewires were placed and retained. Sequentially, an 8-French, 9.5-French, and 7-French sheaths were placed, which were passed a St. Jude [phone] single coil active fixation defibrillator lead, serial #[id number], Medtronic MB2 coronary sinus cannulation system and a St. Jude 2080 ATC active fixation atrial lead, serial # [id number].  Under fluoroscopic guidance, the RV lead was manipulated at the right ventricular apex, where it was deployed and then  subsequently had to be repositioned.  The final R-wave was 6.2 with a pace impedance of 780, threshold 0.8 at 0.5.  Current threshold was 1.0 mA.  There is no diaphragmatic pacing at 10 V.  The current of injury was brisk.  This lead had been secured to the prepectoral fascia during deployment of the other leads and then was repositioned as noted above.  It was re-secured to the prepectoral fascia.  The coronary sinus was cannulated without difficulty.  Venography demonstrated a posterolateral branch, and midlateral branch, and high anterolateral branch.  The former was targeted.  The RV, LV delays in this branch were approximately 80-100 milliseconds.  A St. Jude 1458Q lead, serial #[id number] was deployed to the distal tip was at the junction between the mid and distal 3rd.  In this location, the RV, LV electrogram was greater than 100 milliseconds.  In this location, the bipolar L wave was 1.5 with a pacing impedance of 1025, a threshold of about 2 V at 0.5 milliseconds.  Multiple parameters were checked, pacing parameters were stable, and all configurations tested at threshold less than 2 V.  This is notable as there is micro-dislodgement of the lead and loss of capture in the proximal poles.  The atrial lead was deployed.  Multiple sites were mapped before we found a place where the P-wave was greater than 1.5 mV, this was turned out to be on the lateral wall.  The amplitude was 2.8,  with a pace impedance of 523, threshold 1 V at 0.5.  Current threshold was 2.0 mA. There is no diaphragmatic pacing at 10 V, and the current of injury was brisk.  This lead was secured to the prepectoral fashion and the LV deployment system was removed, and the aforementioned micro-dislodgement occurred.  The leads were then attached to a St. Adult nurse defibrillator; model number [id number], serial [id number].  P synchronous pacing were identified, and was at this juncture that we identified loss of  capture on the proximal poles.  Following attachment of all leads to the device, the bipolar P-wave was 2.1 with a pace impedance of 490, threshold 1 V at 0.5.  The R-wave was 9.4, with the pace impedance of 650, threshold 0.5 at 0.5.  The LV impedance was 1100 threshold, 2 V at 1.  High-voltage impedance of 80 ohms.  The VV delay was programmed at -50 at LV first.  The pocket was copiously irrigated with antibiotic containing saline solution.  Surgicel was placed in the pocket, and the pocket secured to the prepectoral fascia.  The wound was then closed in 2 layers in normal fashion.  The wound was washed, dried, and a benzoin and Steri-Strip was applied.  Needle counts, sponge counts, and instrument counts were correct at the end of the procedure according to the staff. Defibrillation threshold testing was not done.     Duke Salvia, MD, [facility]     SCK/MEDQ  D:  12/11/2012  T:  12/12/2012  Job:  [id number]

## 2012-12-12 NOTE — Discharge Summary (Signed)
ELECTROPHYSIOLOGY DISCHARGE SUMMARY    Patient ID: Corey Mccullough,  MRN: [id number], DOB/AGE: 76-Nov-1936 76 y.o.  Admit date: 12/11/2012 Discharge date: 12/12/2012  Primary Care Physician: None currently Primary Cardiologist: Patty Sermons, MD  Primary Discharge Diagnosis:  1. Combined ischemic/nonischemic CM 2. Chronic systolic HF s/p BiV ICD implantation for CRT and primary prevention of SCD 3. LBBB  Secondary Discharge Diagnoses:  1. Moderate AS 2. Atrial fibrillation 3. Prior TIA 4. HTN 5. Dyslipidemia 6. GERD  Procedures This Admission:   1. BiV ICD implantation 12/11/2012 RA lead - St. Jude 2080 ATC active fixation atrial lead, serial # [id number] RV lead - St. Jude [phone] single coil active fixation defibrillator lead, serial #[id number] LV lead - St. Jude 1458Q lead, serial #[id number]  ICD - St. Jude Quadra defibrillator; model number [id number], serial [id number]   History and Hospital Course:  Mr. Jolliff is a 78 year old man with combined ischemic/nonischemic CM, chronic systolic HF and LBBB who presented yesterday for elective BiV ICD implantation for CRT and primary prevention of SCD. Mr. Gilchrest tolerated this procedure well without any immediate complication. He remains hemodynamically stable and afebrile. His chest xray shows stable lead placement without pneumothorax. His device interrogation shows normal BiV ICD function with stable lead parameters/measurements. His implant site is intact without significant bleeding or hematoma. Telemetry shows no arrhythmias. He has been given discharge instructions including wound care and activity restrictions. He will follow-up in 10 days for wound check. His carvedilol dose was up-titrated for better BP control otherwise there were no changes made to his medications. He has been seen, examined and deemed stable for discharge today by Dr. Berton Mount.  Physical Exam: Vitals: Blood pressure 159/116, pulse 75, temperature 97.5 F (36.4 C),  temperature source Oral, resp. rate 20, height 6' (1.829 m), weight 215 lb 9.8 oz (97.8 kg), SpO2 97.00%.  General: Well developed, well appearing 78 year old male in no acute distress Heart: Regular S1, S2 with III/VI systolic murmur. No rub or S3. Lungs: CTA bilaterally without wheezes, rales or rhonchi. Abdomen: Soft, nondistended. Extremities: No cyanosis, clubbing or edema. Skin: Left upper chest/implant site intact without significant bleeding or hematoma.  Labs: Lab Results  Component Value Date   WBC 5.1 12/05/2012   HGB 14.8 12/05/2012   HCT 43.7 12/05/2012   MCV 94.6 12/05/2012   PLT 185.0 12/05/2012     Recent Labs Lab 12/05/12 1245  NA 139  K 4.3  CL 104  CO2 29  BUN 24*  CREATININE 1.3  CALCIUM 9.5  PROT 7.7  BILITOT 0.7  ALKPHOS 76  ALT 14  AST 20  GLUCOSE 98    Disposition:  The patient is being discharged in stable condition.  Follow-up:     Follow-up Information   Follow up with [facility] On 12/20/2012. (At 3:30 PM for wound check)    Contact information:   [address] [place] [address] [phone]      Follow up with Sherryl Manges, MD On 03/13/2013. (At 10:30 AM)    Contact information:   [address] [place] [address] [phone]     Discharge Medications:    Medication List    TAKE these medications       aspirin 81 MG tablet  Take 1 tablet (81 mg total) by mouth daily.     atorvastatin 80 MG tablet  Commonly known as:  LIPITOR  Take 1 tablet (80 mg  total) by mouth daily.     carvedilol 12.5 MG tablet  Commonly known as:  COREG  Take 2 tablets (25 mg total) by mouth 2 (two) times daily with a meal.     diltiazem 30 MG tablet  Commonly known as:  CARDIZEM  ONE TABLET EVERY SIX HOURS AS NEEDED FOR PALPITATIONS     losartan-hydrochlorothiazide 100-25 MG per tablet  Commonly known as:  HYZAAR  1/2 tablet (2) two times daily     omeprazole 20 MG capsule  Commonly known as:   PRILOSEC  Take 20 mg by mouth daily.       Duration of Discharge Encounter: Greater than 30 minutes including physician time.  Signed, EDMISTEN, Nehemiah Settle, PA-C 12/12/2012, 9:00 AM

## 2012-12-20 ENCOUNTER — Other Ambulatory Visit: Payer: Self-pay

## 2012-12-20 ENCOUNTER — Encounter: Payer: Self-pay | Admitting: Internal Medicine

## 2012-12-20 ENCOUNTER — Ambulatory Visit (INDEPENDENT_AMBULATORY_CARE_PROVIDER_SITE_OTHER): Payer: Medicare Other | Admitting: *Deleted

## 2012-12-20 DIAGNOSIS — I4891 Unspecified atrial fibrillation: Secondary | ICD-10-CM

## 2012-12-20 DIAGNOSIS — I255 Ischemic cardiomyopathy: Secondary | ICD-10-CM

## 2012-12-20 DIAGNOSIS — I2589 Other forms of chronic ischemic heart disease: Secondary | ICD-10-CM

## 2012-12-20 LAB — ICD DEVICE OBSERVATION
AL AMPLITUDE: 2.2 mv
AL IMPEDENCE ICD: 387.5 Ohm
AL THRESHOLD: 0.75 V
DEV-0020ICD: NEGATIVE
DEVICE MODEL ICD: 7067361
HV IMPEDENCE: 54 Ohm
MODE SWITCH EPISODES: 0
PACEART VT: 0
RV LEAD AMPLITUDE: 8.4 mv
RV LEAD THRESHOLD: 0.625 V
RV LEAD THRESHOLD: 1.125 V
TOT-0010: 2
TZAT-0001SLOWVT: 1
TZAT-0013SLOWVT: 3
TZAT-0020SLOWVT: 1 ms
TZON-0005SLOWVT: 6
TZON-0010SLOWVT: 40 ms
TZST-0001SLOWVT: 2
TZST-0001SLOWVT: 4
TZST-0003SLOWVT: 650 V
TZST-0003SLOWVT: 845 V
TZST-0003SLOWVT: 890 V
VENTRICULAR PACING ICD: 98 pct
VF: 0

## 2012-12-20 NOTE — Progress Notes (Signed)
Wound check defib in clinic  

## 2013-01-08 ENCOUNTER — Telehealth: Payer: Self-pay | Admitting: Cardiology

## 2013-01-08 NOTE — Telephone Encounter (Signed)
New problem   Dee want to know if she can get a copy of pt's ECHO he had done maybe 09/2012. Please fax to [phone] Attn: Domingo Sep

## 2013-02-08 ENCOUNTER — Encounter (INDEPENDENT_AMBULATORY_CARE_PROVIDER_SITE_OTHER): Payer: Medicare Other

## 2013-02-08 DIAGNOSIS — I714 Abdominal aortic aneurysm, without rupture: Secondary | ICD-10-CM

## 2013-02-16 ENCOUNTER — Telehealth: Payer: Self-pay | Admitting: Cardiology

## 2013-02-16 NOTE — Telephone Encounter (Signed)
New Problem: ° ° ° °Patient returned your call.  Please call back. °

## 2013-02-21 NOTE — Telephone Encounter (Signed)
This has been done.

## 2013-03-13 ENCOUNTER — Encounter: Payer: Self-pay | Admitting: Internal Medicine

## 2013-03-13 ENCOUNTER — Ambulatory Visit (INDEPENDENT_AMBULATORY_CARE_PROVIDER_SITE_OTHER): Payer: Medicare Other | Admitting: Internal Medicine

## 2013-03-13 VITALS — BP 128/86 | HR 76 | Ht 72.0 in | Wt 216.8 lb

## 2013-03-13 DIAGNOSIS — I509 Heart failure, unspecified: Secondary | ICD-10-CM

## 2013-03-13 DIAGNOSIS — I119 Hypertensive heart disease without heart failure: Secondary | ICD-10-CM

## 2013-03-13 DIAGNOSIS — I5042 Chronic combined systolic (congestive) and diastolic (congestive) heart failure: Secondary | ICD-10-CM

## 2013-03-13 DIAGNOSIS — I4891 Unspecified atrial fibrillation: Secondary | ICD-10-CM

## 2013-03-13 DIAGNOSIS — I714 Abdominal aortic aneurysm, without rupture, unspecified: Secondary | ICD-10-CM

## 2013-03-13 DIAGNOSIS — I350 Nonrheumatic aortic (valve) stenosis: Secondary | ICD-10-CM

## 2013-03-13 DIAGNOSIS — I255 Ischemic cardiomyopathy: Secondary | ICD-10-CM

## 2013-03-13 DIAGNOSIS — I359 Nonrheumatic aortic valve disorder, unspecified: Secondary | ICD-10-CM

## 2013-03-13 DIAGNOSIS — I2589 Other forms of chronic ischemic heart disease: Secondary | ICD-10-CM

## 2013-03-13 LAB — ICD DEVICE OBSERVATION
AL AMPLITUDE: 3.4 mv
AL THRESHOLD: 1 V
DEV-0020ICD: NEGATIVE
HV IMPEDENCE: 78 Ohm
MODE SWITCH EPISODES: 2
PACEART VT: 0
RV LEAD THRESHOLD: 0.5 V
TOT-0006: 20140310000000
TOT-0010: 2
TZAT-0001SLOWVT: 1
TZAT-0020SLOWVT: 1 ms
TZON-0005SLOWVT: 6
TZON-0010SLOWVT: 40 ms
TZST-0001SLOWVT: 2
TZST-0001SLOWVT: 4
TZST-0003SLOWVT: 650 V
TZST-0003SLOWVT: 845 V
VF: 0

## 2013-03-13 LAB — BASIC METABOLIC PANEL
CO2: 25 mEq/L (ref 19–32)
Chloride: 106 mEq/L (ref 96–112)
Creatinine, Ser: 1.2 mg/dL (ref 0.4–1.5)

## 2013-03-13 MED ORDER — CARVEDILOL 25 MG PO TABS: 25.0000 mg | ORAL_TABLET | Freq: Two times a day (BID) | ORAL | Status: DC

## 2013-03-13 NOTE — Progress Notes (Signed)
Patient Care Team: Pcp Not In System as PCP - General   HPI  Corey Mccullough is a 78 y.o. male Seen in followup for CRT-ICD implantation for primary prevention. He was found to have aortic stenosis with a low gradient but after evaluation was thought to be just moderate. He also has left bundle branch block  He is much improved following CRT with decreasing shortness of breath. He has mild peripheral edema.   Past Medical History  Diagnosis Date  . Hypertension   . Chronic systolic heart failure   . Left ventricular systolic dysfunction   . Aortic stenosis     Moderate-severe 11/13 mean gradient 27 EF 30-35%  . Left bundle branch block   . Hypercholesteremia   . GERD (gastroesophageal reflux disease)   . Ischemic cardiomyopathy     prior IMI  . CHF (congestive heart failure)   . Myocardial infarction   . ICD (implantable cardiac defibrillator) in place 12/11/2012    dual chamber  Dr Graciela Husbands  . Shortness of breath   . Stroke     TIA  . Arthritis     Past Surgical History  Procedure Laterality Date  . Defibrilator  12/11/2012    dual chamber   by Dr Graciela Husbands  . Cataract extraction    . Partial hip arthroplasty      Current Outpatient Prescriptions  Medication Sig Dispense Refill  . aspirin 81 MG tablet Take 1 tablet (81 mg total) by mouth daily.      Marland Kitchen atorvastatin (LIPITOR) 80 MG tablet Take 1 tablet (80 mg total) by mouth daily.  90 tablet  4  . carvedilol (COREG) 12.5 MG tablet Take 2 tablets (25 mg total) by mouth 2 (two) times daily with a meal.  180 tablet  3  . diltiazem (CARDIZEM) 30 MG tablet ONE TABLET EVERY SIX HOURS AS NEEDED FOR PALPITATIONS  30 tablet  12  . losartan-hydrochlorothiazide (HYZAAR) 100-25 MG per tablet 1/2 tablet (2) two times daily  90 tablet  3  . omeprazole (PRILOSEC) 20 MG capsule Take 20 mg by mouth daily.        . [DISCONTINUED] enalapril (VASOTEC) 20 MG tablet Take 1 tablet by mouth Twice daily.       No current facility-administered medications  for this visit.    No Known Allergies  Review of Systems negative except from HPI and PMH  Physical Exam BP 128/86  Pulse 76  Ht 6' (1.829 m)  Wt 216 lb 12.8 oz (98.34 kg)  BMI 29.4 kg/m2 Well developed and well nourished in no acute distress HENT normal E scleral and icterus clear Neck Supple JVP flat; carotids brisk and full Clear to ausculation Part 3/6 systolic murmur with a single S2 *Regular rate and rhythm,   Soft with active bowel sounds No clubbing cyanosis 1+ Edema Alert and oriented, grossly normal motor and sensory function Skin Warm and Dry  ECG demonstrates P. Synchronous pacing with a rapid transition in lead V2.  Assessment and  Plan

## 2013-03-13 NOTE — Assessment & Plan Note (Signed)
Patient is on beta blockers and ARB. We will reassess left ventricular function at that time Dr. TB can consider the addition of Aldactone.renal function potassium in March were reasonable

## 2013-03-13 NOTE — Patient Instructions (Addendum)
Your physician has recommended you make the following change in your medication: 1) Take losartan/hctz (hyzaar) 100/25 one tablet by mouth in the morning. 2) Take coreg (carvedilol) 25 mg one tablet by mouth twice daily/  Your physician recommends that you have lab work today: bmp  Your physician recommends that you schedule a follow-up appointment in: 6-8 weeks with Dr. Patty Sermons.  Remote monitoring is used to monitor your Pacemaker of ICD from home. This monitoring reduces the number of office visits required to check your device to one time per year. It allows [place] to keep an eye on the functioning of your device to ensure it is working properly. You are scheduled for a device check from home on 06/18/13. You may send your transmission at any time that day. If you have a wireless device, the transmission will be sent automatically. After your physician reviews your transmission, you will receive a postcard with your next transmission date.   Your physician wants you to follow-up in: March 2015 with Dr. Graciela Husbands. You will receive a reminder letter in the mail two months in advance. If you don't receive a letter, please call our office to schedule the follow-up appointment.

## 2013-03-13 NOTE — Assessment & Plan Note (Signed)
Functional now class II following CRT. His sensed AV delay is relatively long. We'll need to keep this in mind as tightening up may further improve LV performance and decreased symptoms

## 2013-03-13 NOTE — Assessment & Plan Note (Signed)
We will reassess his aortic valve next month and I will arrange followup with Dr. Patty Sermons thereafter.

## 2013-03-13 NOTE — Assessment & Plan Note (Addendum)
No detected intercurrent atrial fibrillation; notably he is not on anticoagulation

## 2013-04-17 ENCOUNTER — Ambulatory Visit (HOSPITAL_COMMUNITY): Payer: Medicare Other | Attending: Cardiovascular Disease | Admitting: Radiology

## 2013-04-17 DIAGNOSIS — I447 Left bundle-branch block, unspecified: Secondary | ICD-10-CM | POA: Insufficient documentation

## 2013-04-17 DIAGNOSIS — I4891 Unspecified atrial fibrillation: Secondary | ICD-10-CM | POA: Insufficient documentation

## 2013-04-17 DIAGNOSIS — I079 Rheumatic tricuspid valve disease, unspecified: Secondary | ICD-10-CM | POA: Insufficient documentation

## 2013-04-17 DIAGNOSIS — Z8673 Personal history of transient ischemic attack (TIA), and cerebral infarction without residual deficits: Secondary | ICD-10-CM | POA: Insufficient documentation

## 2013-04-17 DIAGNOSIS — R0989 Other specified symptoms and signs involving the circulatory and respiratory systems: Secondary | ICD-10-CM | POA: Insufficient documentation

## 2013-04-17 DIAGNOSIS — I10 Essential (primary) hypertension: Secondary | ICD-10-CM | POA: Insufficient documentation

## 2013-04-17 DIAGNOSIS — I350 Nonrheumatic aortic (valve) stenosis: Secondary | ICD-10-CM

## 2013-04-17 DIAGNOSIS — R0609 Other forms of dyspnea: Secondary | ICD-10-CM | POA: Insufficient documentation

## 2013-04-17 DIAGNOSIS — Z87891 Personal history of nicotine dependence: Secondary | ICD-10-CM | POA: Insufficient documentation

## 2013-04-17 DIAGNOSIS — I080 Rheumatic disorders of both mitral and aortic valves: Secondary | ICD-10-CM | POA: Insufficient documentation

## 2013-04-17 DIAGNOSIS — E7800 Pure hypercholesterolemia, unspecified: Secondary | ICD-10-CM | POA: Insufficient documentation

## 2013-04-17 DIAGNOSIS — I359 Nonrheumatic aortic valve disorder, unspecified: Secondary | ICD-10-CM

## 2013-04-17 DIAGNOSIS — I379 Nonrheumatic pulmonary valve disorder, unspecified: Secondary | ICD-10-CM | POA: Insufficient documentation

## 2013-04-17 DIAGNOSIS — I2589 Other forms of chronic ischemic heart disease: Secondary | ICD-10-CM | POA: Insufficient documentation

## 2013-04-17 DIAGNOSIS — I509 Heart failure, unspecified: Secondary | ICD-10-CM | POA: Insufficient documentation

## 2013-04-17 NOTE — Progress Notes (Signed)
Echocardiogram performed.  

## 2013-05-11 ENCOUNTER — Ambulatory Visit (INDEPENDENT_AMBULATORY_CARE_PROVIDER_SITE_OTHER): Payer: Medicare Other | Admitting: Cardiology

## 2013-05-11 ENCOUNTER — Encounter: Payer: Self-pay | Admitting: Cardiology

## 2013-05-11 VITALS — BP 134/85 | HR 77 | Ht 72.0 in | Wt 219.0 lb

## 2013-05-11 DIAGNOSIS — I4891 Unspecified atrial fibrillation: Secondary | ICD-10-CM

## 2013-05-11 DIAGNOSIS — E785 Hyperlipidemia, unspecified: Secondary | ICD-10-CM

## 2013-05-11 DIAGNOSIS — I359 Nonrheumatic aortic valve disorder, unspecified: Secondary | ICD-10-CM

## 2013-05-11 DIAGNOSIS — I350 Nonrheumatic aortic (valve) stenosis: Secondary | ICD-10-CM

## 2013-05-11 DIAGNOSIS — I480 Paroxysmal atrial fibrillation: Secondary | ICD-10-CM

## 2013-05-11 DIAGNOSIS — E7800 Pure hypercholesterolemia, unspecified: Secondary | ICD-10-CM

## 2013-05-11 NOTE — Assessment & Plan Note (Signed)
The patient has had very few brief episodes of paroxysmal atrial fibrillation.  He has Cardizem 30 mg tablets on hand if he needs but he rarely has to take one.

## 2013-05-11 NOTE — Progress Notes (Signed)
Corey Mccullough Date of Birth:  1934-10-08 [facility] [address] [place], [place]  [address] [phone]         Fax   [phone]  History of Present Illness: This pleasant 78 year old gentleman is seen for a scheduled followup office visit he has a past history of ischemic cardiomyopathy.  On 12/11/12 he underwent implantation of a CRT implantable cardiac defibrillator by Dr. Graciela Husbands.  He has a past history of moderate aortic stenosis with low gradient secondary to left ventricular dysfunction.  He had an echocardiogram 04/17/13 which showed improvement in his ejection fraction from 30-35% to 50% now.  He has moderate but not severe aortic stenosis with peak gradient of 47 and mean gradient of 32. Patient states that his shortness of breath is much improved since his pacemaker defibrillator was implanted  Current Outpatient Prescriptions  Medication Sig Dispense Refill  . aspirin 81 MG tablet Take 1 tablet (81 mg total) by mouth daily.      Marland Kitchen atorvastatin (LIPITOR) 80 MG tablet Take 1 tablet (80 mg total) by mouth daily.  90 tablet  4  . carvedilol (COREG) 25 MG tablet Take 1 tablet (25 mg total) by mouth 2 (two) times daily with a meal.  180 tablet  3  . diltiazem (CARDIZEM) 30 MG tablet ONE TABLET EVERY SIX HOURS AS NEEDED FOR PALPITATIONS  30 tablet  12  . losartan-hydrochlorothiazide (HYZAAR) 100-25 MG per tablet Take one tablet by mouth every morning      . omeprazole (PRILOSEC) 20 MG capsule Take 20 mg by mouth daily.        . [DISCONTINUED] enalapril (VASOTEC) 20 MG tablet Take 1 tablet by mouth Twice daily.       No current facility-administered medications for this visit.    No Known Allergies  Patient Active Problem List   Diagnosis Date Noted  . Abdominal aortic aneurysm 03/02/2011    Priority: High  . Essential hypertension 03/02/2011    Priority: High  . Hypercholesterolemia 03/02/2011    Priority: Medium  . Chronic combined systolic and diastolic  congestive heart failure 03/02/2011    Priority: Medium  . PAF (paroxysmal atrial fibrillation) 05/11/2013  . Atrial fibrillation 09/08/2012  . Cardiomyopathy, ischemic and nonischemic   . Aortic stenosis 03/02/2011  . Left bundle branch block 03/02/2011    History  Smoking status  . Former Smoker -- 1.00 packs/day for 50 years  . Types: Cigarettes  . Quit date: 03/01/2006  Smokeless tobacco  . Never Used    History  Alcohol Use  . Yes    Comment: occasional    Family History  Problem Relation Age of Onset  . Adopted: Yes    Review of Systems: Constitutional: no fever chills diaphoresis or fatigue or change in weight.  Head and neck: no hearing loss, no epistaxis, no photophobia or visual disturbance. Respiratory: No cough, shortness of breath or wheezing. Cardiovascular: No chest pain peripheral edema, palpitations. Gastrointestinal: No abdominal distention, no abdominal pain, no change in bowel habits hematochezia or melena. Genitourinary: No dysuria, no frequency, no urgency, no nocturia. Musculoskeletal:No arthralgias, no back pain, no gait disturbance or myalgias. Neurological: No dizziness, no headaches, no numbness, no seizures, no syncope, no weakness, no tremors. Hematologic: No lymphadenopathy, no easy bruising. Psychiatric: No confusion, no hallucinations, no sleep disturbance.    Physical Exam: Filed Vitals:   05/11/13 1024  BP: 134/85  Pulse: 77   the general appearance reveals a well-developed elderly gentleman in  no distress.The head and neck exam reveals pupils equal and reactive.  Extraocular movements are full.  There is no scleral icterus.  The mouth and pharynx are normal.  The neck is supple.  The carotids reveal no bruits.  The jugular venous pressure is normal.  The  thyroid is not enlarged.  There is no lymphadenopathy.  There is a defibrillator implant in the left upper chest which is well healed  The chest is clear to percussion and  auscultation.  There are no rales or rhonchi.  Expansion of the chest is symmetrical.  The precordium is quiet.  The first heart sound is normal.  The second heart sound is physiologically split.  There is grade 2/6 systolic ejection murmur at the aortic area  There is no abnormal lift or heave.  The abdomen is soft and nontender.  The bowel sounds are normal.  The liver and spleen are not enlarged.  There are no abdominal masses.  There are no abdominal bruits.  Extremities reveal good pedal pulses.  There are varicose veins present.  There is no phlebitis or edema.  There is no cyanosis or clubbing.  Strength is normal and symmetrical in all extremities.  There is no lateralizing weakness.  There are no sensory deficits.  The skin is warm and dry.  There is no rash.     Assessment / Plan: Overall the patient is doing very well.  Stay on same medication.  Recheck in 4 months for office visit EKG and fasting lab work including CBC lipid panel hepatic function panel and basal metabolic panel

## 2013-05-11 NOTE — Assessment & Plan Note (Signed)
Patient has a history of hypercholesterolemia.  He is on Lipitor 80 mg daily.  We will plan to check lab work at his next visit.  Is not having any myalgias or side effects.

## 2013-05-11 NOTE — Assessment & Plan Note (Signed)
The patient is not having any cardinal symptoms from his aortic stenosis

## 2013-05-11 NOTE — Patient Instructions (Addendum)
Your physician recommends that you schedule a follow-up appointment in: 4 months   Your physician recommends that you return for a FASTING lipid profile: 4 months   Your physician recommends that you continue on your current medications as directed. Please refer to the Current Medication list given to you today.

## 2013-06-18 ENCOUNTER — Ambulatory Visit (INDEPENDENT_AMBULATORY_CARE_PROVIDER_SITE_OTHER): Payer: Medicare Other | Admitting: *Deleted

## 2013-06-18 DIAGNOSIS — I509 Heart failure, unspecified: Secondary | ICD-10-CM

## 2013-06-18 DIAGNOSIS — Z95810 Presence of automatic (implantable) cardiac defibrillator: Secondary | ICD-10-CM

## 2013-06-18 DIAGNOSIS — I255 Ischemic cardiomyopathy: Secondary | ICD-10-CM

## 2013-06-18 DIAGNOSIS — I2589 Other forms of chronic ischemic heart disease: Secondary | ICD-10-CM

## 2013-06-18 DIAGNOSIS — I5042 Chronic combined systolic (congestive) and diastolic (congestive) heart failure: Secondary | ICD-10-CM

## 2013-06-19 LAB — REMOTE ICD DEVICE
AL IMPEDENCE ICD: 400 Ohm
BAMS-0001: 150 {beats}/min
BAMS-0003: 70 {beats}/min
LV LEAD IMPEDENCE ICD: 1175 Ohm
LV LEAD THRESHOLD: 3.875 V
RV LEAD IMPEDENCE ICD: 390 Ohm
RV LEAD THRESHOLD: 0.75 V
TZAT-0004SLOWVT: 8
TZAT-0013SLOWVT: 3
TZAT-0018SLOWVT: NEGATIVE
TZON-0003SLOWVT: 300 ms
TZST-0001SLOWVT: 2
TZST-0001SLOWVT: 3
TZST-0001SLOWVT: 5
TZST-0003SLOWVT: 845 V
TZST-0003SLOWVT: 890 V

## 2013-06-26 ENCOUNTER — Telehealth: Payer: Self-pay | Admitting: *Deleted

## 2013-06-26 NOTE — Telephone Encounter (Signed)
Per SK pt to be scheduled for device check to check different vectors on LV lead. LMOM for pt to return call/kwm

## 2013-06-27 NOTE — Telephone Encounter (Signed)
Pt returned call to set up device check but can not schedule at this time. Pt to be called back on 07-02-13.

## 2013-07-06 ENCOUNTER — Ambulatory Visit (INDEPENDENT_AMBULATORY_CARE_PROVIDER_SITE_OTHER): Payer: Medicare Other | Admitting: *Deleted

## 2013-07-06 DIAGNOSIS — I2589 Other forms of chronic ischemic heart disease: Secondary | ICD-10-CM

## 2013-07-06 DIAGNOSIS — I255 Ischemic cardiomyopathy: Secondary | ICD-10-CM

## 2013-07-06 LAB — ICD DEVICE OBSERVATION
AL AMPLITUDE: 1.7 mv
AL IMPEDENCE ICD: 350 Ohm
ATRIAL PACING ICD: 7.7 pct
BAMS-0001: 150 {beats}/min
BAMS-0003: 70 {beats}/min
DEV-0020ICD: NEGATIVE
DEVICE MODEL ICD: 7067361
FVT: 0
HV IMPEDENCE: 81 Ohm
LV LEAD IMPEDENCE ICD: 587.5 Ohm
LV LEAD THRESHOLD: 1.75 v
LV LEAD THRESHOLD: 2.88 v
LV LEAD THRESHOLD: 3.5 v
MODE SWITCH EPISODES: 3
PACEART VT: 0
RV LEAD AMPLITUDE: 9.8 mv
RV LEAD IMPEDENCE ICD: 375 Ohm
RV LEAD THRESHOLD: 0.75 v
TOT-0006: 20140310000000
TOT-0007: 0
TOT-0008: 0
TOT-0009: 0
TOT-0010: 3
TZAT-0001SLOWVT: 1
TZAT-0004SLOWVT: 8
TZAT-0012SLOWVT: 200 ms
TZAT-0013SLOWVT: 3
TZAT-0018SLOWVT: NEGATIVE
TZAT-0019SLOWVT: 7.5 v
TZAT-0020SLOWVT: 1 ms
TZON-0003SLOWVT: 300 ms
TZON-0004SLOWVT: 30
TZON-0005SLOWVT: 6
TZON-0010SLOWVT: 40 ms
TZST-0001SLOWVT: 2
TZST-0001SLOWVT: 3
TZST-0001SLOWVT: 4
TZST-0001SLOWVT: 5
TZST-0003SLOWVT: 650 v
TZST-0003SLOWVT: 845 v
TZST-0003SLOWVT: 890 v
VENTRICULAR PACING ICD: 99.52 pct
VF: 0

## 2013-07-06 NOTE — Progress Notes (Signed)
Corey Mccullough was seen today in clinic for elevated LV threshold noted on [facility] 06/18/13.  We tested him at different vectors and settled on Distal tip 1-> RV coil with a threshold of 1.75@1 .0.  Follow up as scheduled with Merlin 09/17/13.

## 2013-07-25 ENCOUNTER — Encounter: Payer: Self-pay | Admitting: Internal Medicine

## 2013-07-30 ENCOUNTER — Encounter: Payer: Self-pay | Admitting: Internal Medicine

## 2013-09-11 ENCOUNTER — Ambulatory Visit (HOSPITAL_COMMUNITY): Payer: Medicare Other | Attending: Cardiology

## 2013-09-11 ENCOUNTER — Ambulatory Visit (INDEPENDENT_AMBULATORY_CARE_PROVIDER_SITE_OTHER): Payer: Medicare Other | Admitting: Cardiology

## 2013-09-11 ENCOUNTER — Encounter: Payer: Self-pay | Admitting: Cardiology

## 2013-09-11 ENCOUNTER — Other Ambulatory Visit: Payer: Medicare Other

## 2013-09-11 VITALS — BP 142/100 | HR 67 | Ht 72.0 in | Wt 213.0 lb

## 2013-09-11 DIAGNOSIS — I119 Hypertensive heart disease without heart failure: Secondary | ICD-10-CM

## 2013-09-11 DIAGNOSIS — I359 Nonrheumatic aortic valve disorder, unspecified: Secondary | ICD-10-CM

## 2013-09-11 DIAGNOSIS — E785 Hyperlipidemia, unspecified: Secondary | ICD-10-CM | POA: Insufficient documentation

## 2013-09-11 DIAGNOSIS — I4891 Unspecified atrial fibrillation: Secondary | ICD-10-CM

## 2013-09-11 DIAGNOSIS — I723 Aneurysm of iliac artery: Secondary | ICD-10-CM | POA: Insufficient documentation

## 2013-09-11 DIAGNOSIS — I10 Essential (primary) hypertension: Secondary | ICD-10-CM | POA: Insufficient documentation

## 2013-09-11 DIAGNOSIS — I350 Nonrheumatic aortic (valve) stenosis: Secondary | ICD-10-CM

## 2013-09-11 DIAGNOSIS — Z87891 Personal history of nicotine dependence: Secondary | ICD-10-CM | POA: Insufficient documentation

## 2013-09-11 DIAGNOSIS — E7800 Pure hypercholesterolemia, unspecified: Secondary | ICD-10-CM

## 2013-09-11 DIAGNOSIS — I509 Heart failure, unspecified: Secondary | ICD-10-CM

## 2013-09-11 DIAGNOSIS — I714 Abdominal aortic aneurysm, without rupture, unspecified: Secondary | ICD-10-CM | POA: Insufficient documentation

## 2013-09-11 DIAGNOSIS — I5042 Chronic combined systolic (congestive) and diastolic (congestive) heart failure: Secondary | ICD-10-CM

## 2013-09-11 DIAGNOSIS — I480 Paroxysmal atrial fibrillation: Secondary | ICD-10-CM

## 2013-09-11 DIAGNOSIS — I2589 Other forms of chronic ischemic heart disease: Secondary | ICD-10-CM

## 2013-09-11 DIAGNOSIS — I255 Ischemic cardiomyopathy: Secondary | ICD-10-CM

## 2013-09-11 LAB — LIPID PANEL
LDL Cholesterol: 66 mg/dL (ref 0–99)
Total CHOL/HDL Ratio: 3
VLDL: 19.8 mg/dL (ref 0.0–40.0)

## 2013-09-11 LAB — HEPATIC FUNCTION PANEL
Albumin: 4.3 g/dL (ref 3.5–5.2)
Bilirubin, Direct: 0.2 mg/dL (ref 0.0–0.3)
Total Protein: 8.6 g/dL — ABNORMAL HIGH (ref 6.0–8.3)

## 2013-09-11 LAB — BASIC METABOLIC PANEL
BUN: 20 mg/dL (ref 6–23)
Chloride: 95 mEq/L — ABNORMAL LOW (ref 96–112)
Creatinine, Ser: 1.2 mg/dL (ref 0.4–1.5)
Glucose, Bld: 108 mg/dL — ABNORMAL HIGH (ref 70–99)
Potassium: 4.1 mEq/L (ref 3.5–5.1)
Sodium: 131 mEq/L — ABNORMAL LOW (ref 135–145)

## 2013-09-11 LAB — CBC
Hemoglobin: 14.7 g/dL (ref 13.0–17.0)
MCHC: 33.7 g/dL (ref 30.0–36.0)
MCV: 94.9 fl (ref 78.0–100.0)
Platelets: 230 10*3/uL (ref 150.0–400.0)

## 2013-09-11 MED ORDER — AMLODIPINE BESYLATE 5 MG PO TABS: 5.0000 mg | ORAL_TABLET | Freq: Every day | ORAL | Status: DC

## 2013-09-11 NOTE — Progress Notes (Signed)
Quick Note:  Please report to patient. The recent labs are stable. Continue same medication and careful diet. No anemia. Liver and kidney function normal. Lipids good. CSD. ______

## 2013-09-11 NOTE — Progress Notes (Signed)
Corey Mccullough Date of Birth:  12-25-1934 [address] [facility] [address] [place], [place]  [address] [phone]         Fax   [phone]  History of Present Illness: This pleasant 78 year old gentleman is seen for a scheduled followup office visit he has a past history of ischemic cardiomyopathy.  On 12/11/12 he underwent implantation of a CRT implantable cardiac defibrillator by Dr. Graciela Husbands.  He has a past history of moderate aortic stenosis with low gradient secondary to left ventricular dysfunction.  He had an echocardiogram 04/17/13 which showed improvement in his ejection fraction from 30-35% to 50% now.  He has moderate but not severe aortic stenosis with peak gradient of 47 and mean gradient of 32 by echocardiogram on 04/17/13. Patient states that his shortness of breath is much improved since his pacemaker defibrillator was implanted.  The patient has a known abdominal aortic aneurysm.  Followup aortic duplex done on 09/11/13 shows slight increase in the size of the aneurysm which now measures 4.6 cm x 4.8 cm on preliminary reading.  Current Outpatient Prescriptions  Medication Sig Dispense Refill  . aspirin 81 MG tablet Take 1 tablet (81 mg total) by mouth daily.      Marland Kitchen atorvastatin (LIPITOR) 80 MG tablet Take 1 tablet (80 mg total) by mouth daily.  90 tablet  4  . carvedilol (COREG) 25 MG tablet Take 1 tablet (25 mg total) by mouth 2 (two) times daily with a meal.  180 tablet  3  . diltiazem (CARDIZEM) 30 MG tablet ONE TABLET EVERY SIX HOURS AS NEEDED FOR PALPITATIONS  30 tablet  12  . losartan-hydrochlorothiazide (HYZAAR) 100-25 MG per tablet Take one tablet by mouth every morning      . omeprazole (PRILOSEC) 20 MG capsule Take 20 mg by mouth daily.        Marland Kitchen amLODipine (NORVASC) 5 MG tablet Take 1 tablet (5 mg total) by mouth daily.  30 tablet  11  . [DISCONTINUED] enalapril (VASOTEC) 20 MG tablet Take 1 tablet by mouth Twice daily.       No current facility-administered medications for  this visit.    No Known Allergies  Patient Active Problem List   Diagnosis Date Noted  . Abdominal aortic aneurysm 03/02/2011    Priority: High  . Essential hypertension 03/02/2011    Priority: High  . Hypercholesterolemia 03/02/2011    Priority: Medium  . Chronic combined systolic and diastolic congestive heart failure 03/02/2011    Priority: Medium  . PAF (paroxysmal atrial fibrillation) 05/11/2013  . Atrial fibrillation 09/08/2012  . Cardiomyopathy, ischemic and nonischemic   . Aortic stenosis 03/02/2011  . Left bundle branch block 03/02/2011    History  Smoking status  . Former Smoker -- 1.00 packs/day for 50 years  . Types: Cigarettes  . Quit date: 03/01/2006  Smokeless tobacco  . Never Used    History  Alcohol Use  . Yes    Comment: occasional    Family History  Problem Relation Age of Onset  . Adopted: Yes    Review of Systems: Constitutional: no fever chills diaphoresis or fatigue or change in weight.  Head and neck: no hearing loss, no epistaxis, no photophobia or visual disturbance. Respiratory: No cough, shortness of breath or wheezing. Cardiovascular: No chest pain peripheral edema, palpitations. Gastrointestinal: No abdominal distention, no abdominal pain, no change in bowel habits hematochezia or melena. Genitourinary: No dysuria, no frequency, no urgency, no nocturia. Musculoskeletal:No arthralgias, no back pain, no gait  disturbance or myalgias. Neurological: No dizziness, no headaches, no numbness, no seizures, no syncope, no weakness, no tremors. Hematologic: No lymphadenopathy, no easy bruising. Psychiatric: No confusion, no hallucinations, no sleep disturbance.    Physical Exam: Filed Vitals:   09/11/13 1017  BP: 142/100  Pulse:    the general appearance reveals a well-developed elderly gentleman in no distress.The head and neck exam reveals pupils equal and reactive.  Extraocular movements are full.  There is no scleral icterus.  The  mouth and pharynx are normal.  The neck is supple.  The carotids reveal no bruits.  The jugular venous pressure is normal.  The  thyroid is not enlarged.  There is no lymphadenopathy.  There is a defibrillator implant in the left upper chest which is well healed  The chest is clear to percussion and auscultation.  There are no rales or rhonchi.  Expansion of the chest is symmetrical.  The precordium is quiet.  The first heart sound is normal.  The second heart sound is physiologically split.  There is grade 2/6 systolic ejection murmur at the aortic area  There is no abnormal lift or heave.  The abdomen is soft and nontender.  The bowel sounds are normal.  The liver and spleen are not enlarged.  There are no abdominal masses.  There are no abdominal bruits.  Extremities reveal good pedal pulses.  There are varicose veins present.  There is no phlebitis or edema.  There is no cyanosis or clubbing.  Strength is normal and symmetrical in all extremities.  There is no lateralizing weakness.  There are no sensory deficits.  The skin is warm and dry.  There is no rash.  EKG today shows normal sinus rhythm with ventricular pacing and is unchanged since 03/13/13   Assessment / Plan: Continue same medication.  Add amlodipine 5 mg one daily.  Await results of today's labs.  Recheck in 4 months for office visit.  We again talked about the importance of limiting his dietary salt.

## 2013-09-11 NOTE — Assessment & Plan Note (Signed)
The patient has not been aware of any recurrent atrial fibrillation since last visit.  He is not on full anticoagulation.  He is on baby aspirin daily.

## 2013-09-11 NOTE — Patient Instructions (Addendum)
Will obtain labs today and call you with the results (lp/bmet/hfp/cbc)  START AMLODIPINE 5 MG DAILY, RX SENT TO PHARMACY  Your physician wants you to follow-up in: 4 MONTH OV You will receive a reminder letter in the mail two months in advance. If you don't receive a letter, please call our office to schedule the follow-up appointment.

## 2013-09-11 NOTE — Assessment & Plan Note (Signed)
Blood pressure is still not well controlled.  I repeated his blood pressure myself and it is still elevated at 142/100.  We will add amlodipine 5 mg one daily to his regimen.  Control of his blood pressure will help to prevent further expansion of his AAA.

## 2013-09-11 NOTE — Assessment & Plan Note (Signed)
Patient has a history of hypercholesterolemia and is on Lipitor.  Blood work today is pending.

## 2013-09-12 ENCOUNTER — Telehealth: Payer: Self-pay | Admitting: *Deleted

## 2013-09-12 NOTE — Telephone Encounter (Signed)
Message copied by Burnell Blanks on Wed Sep 12, 2013  9:00 AM ------      Message from: Cassell Clement      Created: Tue Sep 11, 2013  8:40 PM       Please report to patient.  The recent labs are stable. Continue same medication and careful diet. No anemia. Liver and kidney function normal. Lipids good. CSD. ------

## 2013-09-12 NOTE — Telephone Encounter (Signed)
Advised patient of lab results and mailed patient copy

## 2013-09-17 ENCOUNTER — Ambulatory Visit (INDEPENDENT_AMBULATORY_CARE_PROVIDER_SITE_OTHER): Payer: Medicare Other | Admitting: *Deleted

## 2013-09-17 ENCOUNTER — Encounter: Payer: Self-pay | Admitting: Internal Medicine

## 2013-09-17 DIAGNOSIS — I5042 Chronic combined systolic (congestive) and diastolic (congestive) heart failure: Secondary | ICD-10-CM

## 2013-09-17 DIAGNOSIS — I4891 Unspecified atrial fibrillation: Secondary | ICD-10-CM

## 2013-09-17 DIAGNOSIS — I509 Heart failure, unspecified: Secondary | ICD-10-CM

## 2013-09-17 DIAGNOSIS — I480 Paroxysmal atrial fibrillation: Secondary | ICD-10-CM

## 2013-09-17 LAB — MDC_IDC_ENUM_SESS_TYPE_REMOTE
Battery Remaining Longevity: 50 mo
Battery Remaining Percentage: 83 %
Battery Voltage: 2.98 V
Brady Statistic AS VP Percent: 93 %
Brady Statistic RA Percent Paced: 6.7 %
Date Time Interrogation Session: 20141215070022
Implantable Pulse Generator Serial Number: 7067361
Lead Channel Impedance Value: 380 Ohm
Lead Channel Impedance Value: 380 Ohm
Lead Channel Pacing Threshold Amplitude: 2 V
Lead Channel Pacing Threshold Pulse Width: 0.5 ms
Lead Channel Pacing Threshold Pulse Width: 0.5 ms
Lead Channel Sensing Intrinsic Amplitude: 2.5 mV
Lead Channel Sensing Intrinsic Amplitude: 9 mV
Lead Channel Setting Pacing Amplitude: 2 V
Lead Channel Setting Pacing Amplitude: 3 V
Lead Channel Setting Sensing Sensitivity: 0.5 mV
Zone Setting Detection Interval: 300 ms

## 2013-10-17 ENCOUNTER — Encounter: Payer: Self-pay | Admitting: *Deleted

## 2013-12-02 ENCOUNTER — Other Ambulatory Visit: Payer: Self-pay | Admitting: Cardiovascular Disease

## 2013-12-19 ENCOUNTER — Encounter: Payer: Self-pay | Admitting: Internal Medicine

## 2013-12-19 ENCOUNTER — Ambulatory Visit (INDEPENDENT_AMBULATORY_CARE_PROVIDER_SITE_OTHER): Payer: Medicare Other

## 2013-12-19 DIAGNOSIS — I2589 Other forms of chronic ischemic heart disease: Secondary | ICD-10-CM

## 2013-12-19 DIAGNOSIS — I509 Heart failure, unspecified: Secondary | ICD-10-CM

## 2013-12-19 DIAGNOSIS — I255 Ischemic cardiomyopathy: Secondary | ICD-10-CM

## 2013-12-19 DIAGNOSIS — I5042 Chronic combined systolic (congestive) and diastolic (congestive) heart failure: Secondary | ICD-10-CM

## 2013-12-19 LAB — MDC_IDC_ENUM_SESS_TYPE_REMOTE
Battery Remaining Longevity: 46 mo
Battery Remaining Percentage: 78 %
Battery Voltage: 2.95 V
Brady Statistic AP VP Percent: 7.8 %
Brady Statistic AP VS Percent: 1 %
Brady Statistic AS VS Percent: 1 %
Brady Statistic RA Percent Paced: 7.7 %
Date Time Interrogation Session: 20150318063544
HighPow Impedance: 73 Ohm
HighPow Impedance: 73 Ohm
Implantable Pulse Generator Serial Number: 7067361
Lead Channel Impedance Value: 390 Ohm
Lead Channel Pacing Threshold Amplitude: 1 V
Lead Channel Pacing Threshold Pulse Width: 0.5 ms
Lead Channel Pacing Threshold Pulse Width: 1 ms
Lead Channel Sensing Intrinsic Amplitude: 8.8 mV
Lead Channel Setting Pacing Amplitude: 2 V
Lead Channel Setting Pacing Amplitude: 2 V
Lead Channel Setting Pacing Pulse Width: 0.5 ms
Lead Channel Setting Pacing Pulse Width: 1 ms
MDC IDC MSMT LEADCHNL LV IMPEDANCE VALUE: 540 Ohm
MDC IDC MSMT LEADCHNL LV PACING THRESHOLD AMPLITUDE: 2.25 V
MDC IDC MSMT LEADCHNL RA SENSING INTR AMPL: 3.9 mV
MDC IDC MSMT LEADCHNL RV IMPEDANCE VALUE: 360 Ohm
MDC IDC MSMT LEADCHNL RV PACING THRESHOLD AMPLITUDE: 0.75 V
MDC IDC MSMT LEADCHNL RV PACING THRESHOLD PULSEWIDTH: 0.5 ms
MDC IDC SET LEADCHNL LV PACING AMPLITUDE: 3.25 V
MDC IDC SET LEADCHNL RV SENSING SENSITIVITY: 0.5 mV
MDC IDC SET ZONE DETECTION INTERVAL: 300 ms
MDC IDC STAT BRADY AS VP PERCENT: 92 %
Zone Setting Detection Interval: 250 ms

## 2014-01-02 ENCOUNTER — Encounter: Payer: Self-pay | Admitting: *Deleted

## 2014-01-24 ENCOUNTER — Encounter: Payer: Self-pay | Admitting: Cardiology

## 2014-01-24 ENCOUNTER — Ambulatory Visit (INDEPENDENT_AMBULATORY_CARE_PROVIDER_SITE_OTHER): Payer: Medicare Other | Admitting: Cardiology

## 2014-01-24 VITALS — BP 112/80 | HR 80 | Ht 72.0 in | Wt 213.0 lb

## 2014-01-24 DIAGNOSIS — I359 Nonrheumatic aortic valve disorder, unspecified: Secondary | ICD-10-CM

## 2014-01-24 DIAGNOSIS — I509 Heart failure, unspecified: Secondary | ICD-10-CM

## 2014-01-24 DIAGNOSIS — I714 Abdominal aortic aneurysm, without rupture, unspecified: Secondary | ICD-10-CM

## 2014-01-24 DIAGNOSIS — I2589 Other forms of chronic ischemic heart disease: Secondary | ICD-10-CM

## 2014-01-24 DIAGNOSIS — I4891 Unspecified atrial fibrillation: Secondary | ICD-10-CM

## 2014-01-24 DIAGNOSIS — I5042 Chronic combined systolic (congestive) and diastolic (congestive) heart failure: Secondary | ICD-10-CM

## 2014-01-24 DIAGNOSIS — I255 Ischemic cardiomyopathy: Secondary | ICD-10-CM

## 2014-01-24 DIAGNOSIS — I350 Nonrheumatic aortic (valve) stenosis: Secondary | ICD-10-CM

## 2014-01-24 DIAGNOSIS — I119 Hypertensive heart disease without heart failure: Secondary | ICD-10-CM

## 2014-01-24 MED ORDER — AMLODIPINE BESYLATE 5 MG PO TABS: 5.0000 mg | ORAL_TABLET | Freq: Every day | ORAL | Status: DC

## 2014-01-24 MED ORDER — DILTIAZEM HCL 30 MG PO TABS: ORAL_TABLET | ORAL | Status: DC

## 2014-01-24 MED ORDER — CARVEDILOL 25 MG PO TABS: 25.0000 mg | ORAL_TABLET | Freq: Two times a day (BID) | ORAL | Status: AC

## 2014-01-24 MED ORDER — LOSARTAN POTASSIUM-HCTZ 100-25 MG PO TABS: ORAL_TABLET | ORAL | Status: AC

## 2014-01-24 MED ORDER — ATORVASTATIN CALCIUM 80 MG PO TABS: 80.0000 mg | ORAL_TABLET | Freq: Every day | ORAL | Status: AC

## 2014-01-24 NOTE — Assessment & Plan Note (Signed)
The patient has not been any symptoms of congestive heart failure or angina pectoris.  No dizzy spells or syncope

## 2014-01-24 NOTE — Assessment & Plan Note (Signed)
Patient has had no symptoms of abdominal pain or back pain related to his abdominal aortic aneurysm.

## 2014-01-24 NOTE — Assessment & Plan Note (Signed)
Blood pressure has improved since addition of amlodipine.  He is not having a side effects or peripheral edema.  He is not having any racing of his heart or palpitations.

## 2014-01-24 NOTE — Patient Instructions (Signed)
Your physician recommends that you continue on your current medications as directed. Please refer to the Current Medication list given to you today.  Your physician wants you to follow-up in: 4 months with fasting labs (lp/bmet/hfp) AND EKG You will receive a reminder letter in the mail two months in advance. If you don't receive a letter, please call our office to schedule the follow-up appointment.  

## 2014-01-24 NOTE — Progress Notes (Signed)
Corey BatemanBobby Mccullough Date of Birth:  11/07/1934 [address] [facility] [address] [place], [place]  [id number] [phone]         Fax   [phone]  History of Present Illness: This pleasant 79 year old gentleman is seen for a scheduled followup office visit.   he has a past history of ischemic cardiomyopathy.  On 12/11/12 he underwent implantation of a CRT implantable cardiac defibrillator by Dr. Graciela HusbandsKlein.  He has a past history of moderate aortic stenosis with low gradient secondary to left ventricular dysfunction.  He had an echocardiogram 04/17/13 which showed improvement in his ejection fraction from 30-35% to 50% now.  He has moderate but not severe aortic stenosis with peak gradient of 47 and mean gradient of 32 by echocardiogram on 04/17/13. Patient states that his shortness of breath is much improved since his pacemaker defibrillator was implanted.  The patient has a known abdominal aortic aneurysm.  Followup aortic duplex done on 09/11/13 shows slight increase in the size of the aneurysm which now measures 4.6 cm x 4.8 cm on preliminary reading.  Since last visit he has had no new cardiac symptoms.  His blood pressure has improved since the addition of amlodipine. Current Outpatient Prescriptions  Medication Sig Dispense Refill  . amLODipine (NORVASC) 5 MG tablet Take 1 tablet (5 mg total) by mouth daily.  90 tablet  3  . aspirin 81 MG tablet Take 1 tablet (81 mg total) by mouth daily.      Marland Kitchen. atorvastatin (LIPITOR) 80 MG tablet Take 1 tablet (80 mg total) by mouth daily.  90 tablet  3  . carvedilol (COREG) 25 MG tablet Take 1 tablet (25 mg total) by mouth 2 (two) times daily with a meal.  180 tablet  3  . diltiazem (CARDIZEM) 30 MG tablet TAKE ONE TABLET BY MOUTH EVERY 6 HOURS AS NEEDED FOR  PALPITATIONS  30 tablet  5  . losartan-hydrochlorothiazide (HYZAAR) 100-25 MG per tablet Take one tablet by mouth every morning  90 tablet  3  . omeprazole (PRILOSEC) 20 MG capsule Take 20 mg by mouth daily.          . [DISCONTINUED] enalapril (VASOTEC) 20 MG tablet Take 1 tablet by mouth Twice daily.       No current facility-administered medications for this visit.    No Known Allergies  Patient Active Problem List   Diagnosis Date Noted  . Abdominal aortic aneurysm 03/02/2011    Priority: High  . Benign hypertensive heart disease without heart failure 03/02/2011    Priority: High  . Hypercholesterolemia 03/02/2011    Priority: Medium  . Chronic combined systolic and diastolic congestive heart failure 03/02/2011    Priority: Medium  . PAF (paroxysmal atrial fibrillation) 05/11/2013  . Cardiomyopathy, ischemic and nonischemic   . Aortic stenosis 03/02/2011  . Left bundle branch block 03/02/2011    History  Smoking status  . Former Smoker -- 1.00 packs/day for 50 years  . Types: Cigarettes  . Quit date: 03/01/2006  Smokeless tobacco  . Never Used    History  Alcohol Use  . Yes    Comment: occasional    Family History  Problem Relation Age of Onset  . Adopted: Yes    Review of Systems: Constitutional: no fever chills diaphoresis or fatigue or change in weight.  Head and neck: no hearing loss, no epistaxis, no photophobia or visual disturbance. Respiratory: No cough, shortness of breath or wheezing. Cardiovascular: No chest pain peripheral edema, palpitations. Gastrointestinal:  No abdominal distention, no abdominal pain, no change in bowel habits hematochezia or melena. Genitourinary: No dysuria, no frequency, no urgency, no nocturia. Musculoskeletal:No arthralgias, no back pain, no gait disturbance or myalgias. Neurological: No dizziness, no headaches, no numbness, no seizures, no syncope, no weakness, no tremors. Hematologic: No lymphadenopathy, no easy bruising. Psychiatric: No confusion, no hallucinations, no sleep disturbance.    Physical Exam: Filed Vitals:   01/24/14 1030  BP: 112/80  Pulse:    the general appearance reveals a well-developed elderly gentleman  in no distress.The head and neck exam reveals pupils equal and reactive.  Extraocular movements are full.  There is no scleral icterus.  The mouth and pharynx are normal.  The neck is supple.  The carotids reveal no bruits.  The jugular venous pressure is normal.  The  thyroid is not enlarged.  There is no lymphadenopathy.  There is a defibrillator implant in the left upper chest which is well healed  The chest is clear to percussion and auscultation.  There are no rales or rhonchi.  Expansion of the chest is symmetrical.  The precordium is quiet.  The first heart sound is normal.  The second heart sound is physiologically split.  There is grade 2/6 systolic ejection murmur at the aortic area  There is no abnormal lift or heave.  The abdomen is soft and nontender.  The bowel sounds are normal.  The liver and spleen are not enlarged.  There are no abdominal masses.  There are no abdominal bruits.  Extremities reveal good pedal pulses.  There are varicose veins present.  There is no phlebitis or edema.  There is no cyanosis or clubbing.  Strength is normal and symmetrical in all extremities.  There is no lateralizing weakness.  There are no sensory deficits.  The skin is warm and dry.  There is no rash.     Assessment / Plan: Continue same medication.  Continue amlodipine 5 mg one daily.    Recheck in 4 months for office visit, EKG, fasting lipid panel, hepatic function panel, and basal metabolic panel.  We again talked about the importance of limiting his dietary salt.

## 2014-02-04 ENCOUNTER — Emergency Department (HOSPITAL_COMMUNITY): Payer: Medicare Other

## 2014-02-04 ENCOUNTER — Emergency Department (HOSPITAL_COMMUNITY)
Admission: EM | Admit: 2014-02-04 | Discharge: 2014-02-05 | Disposition: A | Discharge status: Home / Self Care | Payer: Medicare Other | Attending: Emergency Medicine | Admitting: Emergency Medicine

## 2014-02-04 ENCOUNTER — Encounter (HOSPITAL_COMMUNITY): Payer: Self-pay | Admitting: Emergency Medicine

## 2014-02-04 DIAGNOSIS — K219 Gastro-esophageal reflux disease without esophagitis: Secondary | ICD-10-CM | POA: Insufficient documentation

## 2014-02-04 DIAGNOSIS — Y929 Unspecified place or not applicable: Secondary | ICD-10-CM | POA: Insufficient documentation

## 2014-02-04 DIAGNOSIS — R296 Repeated falls: Secondary | ICD-10-CM | POA: Insufficient documentation

## 2014-02-04 DIAGNOSIS — R002 Palpitations: Secondary | ICD-10-CM | POA: Insufficient documentation

## 2014-02-04 DIAGNOSIS — Y939 Activity, unspecified: Secondary | ICD-10-CM | POA: Insufficient documentation

## 2014-02-04 DIAGNOSIS — Z87891 Personal history of nicotine dependence: Secondary | ICD-10-CM | POA: Insufficient documentation

## 2014-02-04 DIAGNOSIS — IMO0002 Reserved for concepts with insufficient information to code with codable children: Secondary | ICD-10-CM | POA: Insufficient documentation

## 2014-02-04 DIAGNOSIS — I5022 Chronic systolic (congestive) heart failure: Secondary | ICD-10-CM | POA: Insufficient documentation

## 2014-02-04 DIAGNOSIS — M129 Arthropathy, unspecified: Secondary | ICD-10-CM | POA: Insufficient documentation

## 2014-02-04 DIAGNOSIS — I10 Essential (primary) hypertension: Secondary | ICD-10-CM | POA: Insufficient documentation

## 2014-02-04 DIAGNOSIS — I252 Old myocardial infarction: Secondary | ICD-10-CM | POA: Insufficient documentation

## 2014-02-04 DIAGNOSIS — Z79899 Other long term (current) drug therapy: Secondary | ICD-10-CM | POA: Insufficient documentation

## 2014-02-04 DIAGNOSIS — E7800 Pure hypercholesterolemia, unspecified: Secondary | ICD-10-CM | POA: Insufficient documentation

## 2014-02-04 DIAGNOSIS — Z8673 Personal history of transient ischemic attack (TIA), and cerebral infarction without residual deficits: Secondary | ICD-10-CM | POA: Insufficient documentation

## 2014-02-04 DIAGNOSIS — S0180XA Unspecified open wound of other part of head, initial encounter: Secondary | ICD-10-CM | POA: Insufficient documentation

## 2014-02-04 DIAGNOSIS — Z7982 Long term (current) use of aspirin: Secondary | ICD-10-CM | POA: Insufficient documentation

## 2014-02-04 DIAGNOSIS — Z95810 Presence of automatic (implantable) cardiac defibrillator: Secondary | ICD-10-CM | POA: Insufficient documentation

## 2014-02-04 LAB — I-STAT TROPONIN, ED: Troponin i, poc: 0.02 ng/mL (ref 0.00–0.08)

## 2014-02-04 LAB — CBC
HCT: 42.5 % (ref 39.0–52.0)
HEMOGLOBIN: 14.6 g/dL (ref 13.0–17.0)
MCH: 32.7 pg (ref 26.0–34.0)
MCHC: 34.4 g/dL (ref 30.0–36.0)
MCV: 95.1 fL (ref 78.0–100.0)
PLATELETS: 188 10*3/uL (ref 150–400)
RBC: 4.47 MIL/uL (ref 4.22–5.81)
RDW: 13.6 % (ref 11.5–15.5)
WBC: 6.4 10*3/uL (ref 4.0–10.5)

## 2014-02-04 LAB — BASIC METABOLIC PANEL
BUN: 11 mg/dL (ref 6–23)
CO2: 23 meq/L (ref 19–32)
CREATININE: 0.93 mg/dL (ref 0.50–1.35)
Calcium: 8.8 mg/dL (ref 8.4–10.5)
Chloride: 100 mEq/L (ref 96–112)
GFR calc Af Amer: >90 mL/min (ref >90)
GFR calc non Af Amer: 80 mL/min — ABNORMAL LOW (ref >90)
GLUCOSE: 141 mg/dL — AB (ref 70–99)
Potassium: 3.5 mEq/L — ABNORMAL LOW (ref 3.7–5.3)
Sodium: 140 mEq/L (ref 137–147)

## 2014-02-04 MED ORDER — LORAZEPAM 1 MG PO TABS
1.0000 mg | ORAL_TABLET | Freq: Once | ORAL | Status: AC
  Administered 2014-02-04: 1 mg via ORAL
  Filled 2014-02-04: qty 1

## 2014-02-04 NOTE — ED Notes (Signed)
Patient states that something is wrong and he doesn't know what. Became increasingly agitated with questions about his condition. Currently states that he does not feel palpitations as reported earlier. HR currently 72 and V-Paced, no other cardiac symptoms reported. Otherwise states that he thinks his "kidneys have left him". No prior history of kidney disease of failure per chart and family. Goes on to explain that he hasn't urinated today, but family reports they do not believe he has consumed much liquid today.

## 2014-02-04 NOTE — ED Provider Notes (Signed)
CSN: [id number]     Arrival date & time 02/04/14  1937 History   First MD Initiated Contact with Patient 02/04/14 2142     Chief Complaint  Patient presents with  . Tachycardia     (Consider location/radiation/quality/duration/timing/severity/associated sxs/prior Treatment) HPI  The following history is limited due to patient's uncooperative nature:  This is a 79 y.o. male with PMH of hypertension, myocardial infarction, CHF (ICD in place), multiple TIAs, presenting with palpitations. Onset 1800, at home. Resolved sometime between then and my presentation to bedside.  Described as "my heart was racing." Resolved spontaneously. Patient denies pain, new weakness, numbness, tingling, shortness of breath. His only complaint is his desire for more alcohol. He states, "Give me a damn Budweiser."  His wife states that he fell Saturday, as he does often. Judging by laceration to right forehead, she suspects that he hit his head.   Past Medical History  Diagnosis Date  . Hypertension   . Chronic systolic heart failure   . Left ventricular systolic dysfunction   . Aortic stenosis     Moderate-severe 11/13 mean gradient 27 EF 30-35%  . Left bundle branch block   . Hypercholesteremia   . GERD (gastroesophageal reflux disease)   . Ischemic cardiomyopathy     prior IMI  . CHF (congestive heart failure)   . Myocardial infarction   . ICD (implantable cardiac defibrillator) in place 12/11/2012    dual chamber  Dr Graciela HusbandsKlein  . Shortness of breath   . Stroke     TIA  . Arthritis    Past Surgical History  Procedure Laterality Date  . Defibrilator  12/11/2012    dual chamber   by Dr Graciela HusbandsKlein  . Cataract extraction    . Partial hip arthroplasty     Family History  Problem Relation Age of Onset  . Adopted: Yes   History  Substance Use Topics  . Smoking status: Former Smoker -- 1.00 packs/day for 50 years    Types: Cigarettes    Quit date: 03/01/2006  . Smokeless tobacco: Never Used  . Alcohol  Use: Yes     Comment: occasional    Review of Systems  Constitutional: Negative for fever and chills.  HENT: Negative for facial swelling.   Eyes: Negative for pain and visual disturbance.  Respiratory: Negative for chest tightness and shortness of breath.   Cardiovascular: Positive for palpitations (resolved). Negative for chest pain.  Gastrointestinal: Negative for nausea and vomiting.  Genitourinary: Negative for dysuria.  Musculoskeletal: Positive for arthralgias (Chronic).  Neurological: Negative for headaches.  Psychiatric/Behavioral: Negative for behavioral problems.      Allergies  Review of patient's allergies indicates no known allergies.  Home Medications   Prior to Admission medications   Medication Sig Start Date End Date Taking? Authorizing Provider  amLODipine (NORVASC) 5 MG tablet Take 1 tablet (5 mg total) by mouth daily. 01/24/14  Yes Cassell Clementhomas Brackbill, MD  aspirin 81 MG tablet Take 1 tablet (81 mg total) by mouth daily. 09/08/12  Yes Duke SalviaSteven C Klein, MD  atorvastatin (LIPITOR) 80 MG tablet Take 1 tablet (80 mg total) by mouth daily. 01/24/14  Yes Cassell Clementhomas Brackbill, MD  carvedilol (COREG) 25 MG tablet Take 1 tablet (25 mg total) by mouth 2 (two) times daily with a meal. 01/24/14  Yes Cassell Clementhomas Brackbill, MD  diltiazem (CARDIZEM) 30 MG tablet Take 30 mg by mouth every 6 (six) hours as needed (palpitations).   Yes Historical Provider, MD  losartan-hydrochlorothiazide (HYZAAR) 100-25 MG per  tablet Take one tablet by mouth every morning 01/24/14  Yes Cassell Clementhomas Brackbill, MD  omeprazole (PRILOSEC) 20 MG capsule Take 20 mg by mouth daily.     Yes Historical Provider, MD   BP 117/65  Pulse 73  Temp(Src) 98.3 F (36.8 C) (Oral)  Resp 19  Ht 6' (1.829 m)  Wt 170 lb (77.111 kg)  BMI 23.05 kg/m2  SpO2 99% Physical Exam  Constitutional: He is oriented to person, place, and time. He appears well-developed and well-nourished. No distress.  HENT:  Head: Normocephalic and  atraumatic.  Mouth/Throat: No oropharyngeal exudate.  Eyes: Conjunctivae are normal. Pupils are equal, round, and reactive to light. No scleral icterus.  Neck: Normal range of motion. No tracheal deviation present. No thyromegaly present.  Cardiovascular: Normal rate, regular rhythm and normal heart sounds.  Exam reveals no gallop and no friction rub.   No murmur heard. Pulmonary/Chest: Effort normal and breath sounds normal. No stridor. No respiratory distress. He has no wheezes. He has no rales. He exhibits no tenderness.  Abdominal: Soft. He exhibits no distension and no mass. There is no tenderness. There is no rebound and no guarding.  Musculoskeletal: Normal range of motion. He exhibits no edema.  Neurological: He is alert and oriented to person, place, and time. No cranial nerve deficit or sensory deficit. GCS eye subscore is 4. GCS verbal subscore is 5. GCS motor subscore is 6.  No new neurologic deficits appreciated  Patient ambulates without assistance  Skin: Skin is warm and dry. He is not diaphoretic.  Superficial laceration to the right for head measures about 3 cm in length. Well approximated. Hemostatic. No signs of infection.    ED Course  Procedures (including critical care time) Labs Review Labs Reviewed  BASIC METABOLIC PANEL - Abnormal; Notable for the following:    Potassium 3.5 (*)    Glucose, Bld 141 (*)    GFR calc non Af Amer 80 (*)    All other components within normal limits  CBC  I-STAT TROPOININ, ED    Imaging Review Dg Chest Port 1 View  02/04/2014   CLINICAL DATA:  Tachycardia  EXAM: PORTABLE CHEST - 1 VIEW  COMPARISON:  12/12/2012  FINDINGS: Chronic interstitial markings. No focal consolidation. No pleural effusion or pneumothorax.  The heart is top-normal in size.  Left subclavian ICD.  IMPRESSION: No evidence of acute cardiopulmonary disease.   Electronically Signed   By: Charline BillsSriyesh  Krishnan M.D.   On: 02/04/2014 22:08     EKG  Interpretation   Date/Time:  Monday Feb 04 2014 19:43:29 EDT Ventricular Rate:  80 PR Interval:    QRS Duration: 182 QT Interval:  464 QTC Calculation: 535 R Axis:   -40 Text Interpretation:  Electronic ventricular pacemaker Confirmed by  Bebe ShaggyWICKLINE  MD, Dorinda HillNALD ([id number]) on 02/04/2014 9:05:56 PM      MDM   Final diagnoses:  None    This is a 79 y.o. male with PMH of hypertension, myocardial infarction, CHF (ICD in place), multiple TIAs, presenting with palpitations. Onset 1800, at home. Resolved sometime between then and my presentation to bedside.  Described as "my heart was racing." Resolved spontaneously. Patient denies pain, new weakness, numbness, tingling, shortness of breath. His only complaint is his desire for more alcohol. He states, "Give me a damn Budweiser."  His wife states that he fell Saturday, as he does often. Judging by laceration to right forehead, she suspects that he hit his head.  Examination as above. Vital signs  are within normal limits, specifically normal heart rate. Patient is generally uncooperative. Patient has no new neurologic deficits. He walks without assistance, without complication. There is a small laceration to the right service for it. This wound was sustained 2 days ago; therefore repair not indicated.  Labs reveal no gross abnormalities, including a normal troponin. Negative for ischemic changes on EKG. Positive for pacemaker.  CT head is within normal limits. X-ray of hips, pelvis revealed no acute changes, severe OA to right hip.  Interrogation of his pacemaker has been ordered. I've spoken with Rhona Raider with the manufacturer. She states that, since October, the patient has been in atrial fibrillation twice. The most recent episode was yesterday, for a few seconds. There is absolutely no evidence of ventricular dysrhythmia or defibrillation of any type.  I do not believe that further emergent or inpatient evaluation or treatment is  indicated at this time. Patient's clinical presentation has not changed. Wife and daughter are at bedside and agree with discharge.  Pt stable for discharge, FU.  All questions answered.  Return precautions given.  I have discussed case and care has been guided by my attending physician, Dr. Bebe Shaggy.    Loma Boston, MD 02/05/14 [phone]

## 2014-02-04 NOTE — ED Notes (Signed)
Presents stating, "something is not right. My heart is racing, brackbill is my doctor" pt is unable to speak with RN, visibly uncomfortable, PAcemaker, paced rhythm.  Agitated and fidgety. Bilateral breaths clear.

## 2014-02-05 NOTE — ED Provider Notes (Signed)
I have personally seen and examined the patient.  I have discussed the plan of care with the resident.  I have reviewed the documentation on PMH/FH/Soc. History.  I have reviewed the documentation of the resident and agree.  Pt stable in the ED No signs of acute withdrawal on my evaluation Appropriate workup initiated and agree with d/c home   Joya Gaskinsonald W Shamon Lobo, MD 02/05/14 2319

## 2014-02-05 NOTE — Discharge Instructions (Signed)
Palpitations  A palpitation is the feeling that your heartbeat is irregular or is faster than normal. It may feel like your heart is fluttering or skipping a beat. Palpitations are usually not a serious problem. However, in some cases, you may need further medical evaluation. CAUSES  Palpitations can be caused by:  Smoking.  Caffeine or other stimulants, such as diet pills or energy drinks.  Alcohol.  Stress and anxiety.  Strenuous physical activity.  Fatigue.  Certain medicines.  Heart disease, especially if you have a history of arrhythmias. This includes atrial fibrillation, atrial flutter, or supraventricular tachycardia.  An improperly working pacemaker or defibrillator. DIAGNOSIS  To find the cause of your palpitations, your caregiver will take your history and perform a physical exam. Tests may also be done, including:  Electrocardiography (ECG). This test records the heart's electrical activity.  Cardiac monitoring. This allows your caregiver to monitor your heart rate and rhythm in real time.  Holter monitor. This is a portable device that records your heartbeat and can help diagnose heart arrhythmias. It allows your caregiver to track your heart activity for several days, if needed.  Stress tests by exercise or by giving medicine that makes the heart beat faster. TREATMENT  Treatment of palpitations depends on the cause of your symptoms and can vary greatly. Most cases of palpitations do not require any treatment other than time, relaxation, and monitoring your symptoms. Other causes, such as atrial fibrillation, atrial flutter, or supraventricular tachycardia, usually require further treatment. HOME CARE INSTRUCTIONS   Avoid:  Caffeinated coffee, tea, soft drinks, diet pills, and energy drinks.  Chocolate.  Alcohol.  Stop smoking if you smoke.  Reduce your stress and anxiety. Things that can help you relax include:  A method that measures bodily functions so  you can learn to control them (biofeedback).  Yoga.  Meditation.  Physical activity such as swimming, jogging, or walking.  Get plenty of rest and sleep. SEEK MEDICAL CARE IF:   You continue to have a fast or irregular heartbeat beyond 24 hours.  Your palpitations occur more often. SEEK IMMEDIATE MEDICAL CARE IF:  You develop chest pain or shortness of breath.  You have a severe headache.  You feel dizzy, or you faint. MAKE SURE YOU:  Understand these instructions.  Will watch your condition.  Will get help right away if you are not doing well or get worse. Document Released: 09/17/2000 Document Revised: 01/15/2013 Document Reviewed: 11/19/2011 [facility] Patient Information 2014 [place], [place]. Alcohol Problems Most adults who drink alcohol drink in moderation (not a lot) are at low risk for developing problems related to their drinking. However, all drinkers, including low-risk drinkers, should know about the health risks connected with drinking alcohol. RECOMMENDATIONS FOR LOW-RISK DRINKING  Drink in moderation. Moderate drinking is defined as follows:   Men - no more than 2 drinks per day.  Nonpregnant women - no more than 1 drink per day.  Over age 77 - no more than 1 drink per day. A standard drink is 12 grams of pure alcohol, which is equal to a 12 ounce bottle of beer or wine cooler, a 5 ounce glass of wine, or 1.5 ounces of distilled spirits (such as whiskey, brandy, vodka, or rum).  ABSTAIN FROM (DO NOT DRINK) ALCOHOL:  When pregnant or considering pregnancy.  When taking a medication that interacts with alcohol.  If you are alcohol dependent.  A medical condition that prohibits drinking alcohol (such as ulcer, liver disease, or heart disease). DISCUSS  WITH YOUR CAREGIVER:  If you are at risk for coronary heart disease, discuss the potential benefits and risks of alcohol use: Light to moderate drinking is associated with lower rates of coronary heart  disease in certain populations (for example, men over age 77 and postmenopausal women). Infrequent or nondrinkers are advised not to begin light to moderate drinking to reduce the risk of coronary heart disease so as to avoid creating an alcohol-related problem. Similar protective effects can likely be gained through proper diet and exercise.  Women and the elderly have smaller amounts of body water than men. As a result women and the elderly achieve a higher blood alcohol concentration after drinking the same amount of alcohol.  Exposing a fetus to alcohol can cause a broad range of birth defects referred to as Fetal Alcohol Syndrome (FAS) or Alcohol-Related Birth Defects (ARBD). Although FAS/ARBD is connected with excessive alcohol consumption during pregnancy, studies also have reported neurobehavioral problems in infants born to mothers reporting drinking an average of 1 drink per day during pregnancy.  Heavier drinking (the consumption of more than 4 drinks per occasion by men and more than 3 drinks per occasion by women) impairs learning (cognitive) and psychomotor functions and increases the risk of alcohol-related problems, including accidents and injuries. CAGE QUESTIONS:   Have you ever felt that you should Cut down on your drinking?  Have people Annoyed you by criticizing your drinking?  Have you ever felt bad or Guilty about your drinking?  Have you ever had a drink first thing in the morning to steady your nerves or get rid of a hangover (Eye opener)? If you answered positively to any of these questions: You may be at risk for alcohol-related problems if alcohol consumption is:   Men: Greater than 14 drinks per week or more than 4 drinks per occasion.  Women: Greater than 7 drinks per week or more than 3 drinks per occasion. Do you or your family have a medical history of alcohol-related problems, such as:  Blackouts.  Sexual dysfunction.  Depression.  Trauma.  Liver  dysfunction.  Sleep disorders.  Hypertension.  Chronic abdominal pain.  Has your drinking ever caused you problems, such as problems with your family, problems with your work (or school) performance, or accidents/injuries?  Do you have a compulsion to drink or a preoccupation with drinking?  Do you have poor control or are you unable to stop drinking once you have started?  Do you have to drink to avoid withdrawal symptoms?  Do you have problems with withdrawal such as tremors, nausea, sweats, or mood disturbances?  Does it take more alcohol than in the past to get you high?  Do you feel a strong urge to drink?  Do you change your plans so that you can have a drink?  Do you ever drink in the morning to relieve the shakes or a hangover? If you have answered a number of the previous questions positively, it may be time for you to talk to your caregivers, family, and friends and see if they think you have a problem. Alcoholism is a chemical dependency that keeps getting worse and will eventually destroy your health and relationships. Many alcoholics end up dead, impoverished, or in prison. This is often the end result of all chemical dependency.  Do not be discouraged if you are not ready to take action immediately.  Decisions to change behavior often involve up and down desires to change and feeling like you cannot decide.  Try to think more seriously about your drinking behavior.  Think of the reasons to quit. WHERE TO GO FOR ADDITIONAL INFORMATION   The National Institute on Alcohol Abuse and Alcoholism (NIAAA) BasicStudents.dk  [employer] on Alcoholism and Drug Dependence (NCADD) www.ncadd.org  American Society of Addiction Medicine (ASAM) RoyalDiary.gl  Document Released: 09/20/2005 Document Revised: 12/13/2011 Document Reviewed: 05/08/2008 [facility] Patient Information 2014 [place], [place].

## 2014-03-18 ENCOUNTER — Encounter: Payer: Self-pay | Admitting: Internal Medicine

## 2014-03-18 ENCOUNTER — Encounter (INDEPENDENT_AMBULATORY_CARE_PROVIDER_SITE_OTHER): Payer: Self-pay

## 2014-03-18 ENCOUNTER — Ambulatory Visit (INDEPENDENT_AMBULATORY_CARE_PROVIDER_SITE_OTHER): Payer: Medicare Other | Admitting: Internal Medicine

## 2014-03-18 VITALS — BP 148/97 | HR 79 | Ht 72.0 in | Wt 210.0 lb

## 2014-03-18 DIAGNOSIS — I4891 Unspecified atrial fibrillation: Secondary | ICD-10-CM

## 2014-03-18 DIAGNOSIS — I5042 Chronic combined systolic (congestive) and diastolic (congestive) heart failure: Secondary | ICD-10-CM

## 2014-03-18 DIAGNOSIS — Z95810 Presence of automatic (implantable) cardiac defibrillator: Secondary | ICD-10-CM

## 2014-03-18 DIAGNOSIS — I2589 Other forms of chronic ischemic heart disease: Secondary | ICD-10-CM

## 2014-03-18 DIAGNOSIS — I509 Heart failure, unspecified: Secondary | ICD-10-CM

## 2014-03-18 DIAGNOSIS — I255 Ischemic cardiomyopathy: Secondary | ICD-10-CM

## 2014-03-18 NOTE — Progress Notes (Signed)
Patient Care Team: Pcp Not In System as PCP - General   HPI  Corey BatemanBobby Mccullough is a 79 y.o. male Seen in followup for CRT-ICD implantation for primary prevention. He was found to have aortic stenosis with a low gradient but after evaluation was thought to be just moderate. He also has left bundle branch block  He is much improved following CRT with decreasing shortness of breath. He has mild peripheral edema.  His biggest complaint is difficulty with walking on his right hip. I am   Past Medical History  Diagnosis Date  . Hypertension   . Chronic systolic heart failure   . Left ventricular systolic dysfunction   . Aortic stenosis     Moderate-severe 11/13 mean gradient 27 EF 30-35%  . Left bundle branch block   . Hypercholesteremia   . GERD (gastroesophageal reflux disease)   . Ischemic cardiomyopathy     prior IMI  . CHF (congestive heart failure)   . Myocardial infarction   . ICD (implantable cardiac defibrillator) in place 12/11/2012    dual chamber  Dr Graciela HusbandsKlein  . Shortness of breath   . Stroke     TIA  . Arthritis     Past Surgical History  Procedure Laterality Date  . Defibrilator  12/11/2012    dual chamber   by Dr Graciela HusbandsKlein  . Cataract extraction    . Partial hip arthroplasty      Current Outpatient Prescriptions  Medication Sig Dispense Refill  . aspirin 81 MG tablet Take 1 tablet (81 mg total) by mouth daily.      Marland Kitchen. atorvastatin (LIPITOR) 80 MG tablet Take 1 tablet (80 mg total) by mouth daily.  90 tablet  3  . carvedilol (COREG) 25 MG tablet Take 1 tablet (25 mg total) by mouth 2 (two) times daily with a meal.  180 tablet  3  . diltiazem (CARDIZEM) 30 MG tablet Take 30 mg by mouth every 6 (six) hours as needed (palpitations).      . losartan-hydrochlorothiazide (HYZAAR) 100-25 MG per tablet Take one tablet by mouth every morning  90 tablet  3  . omeprazole (PRILOSEC) 20 MG capsule Take 20 mg by mouth daily.        Marland Kitchen. amLODipine (NORVASC) 5 MG tablet Take 1  tablet (5 mg total) by mouth daily.  90 tablet  3  . [DISCONTINUED] enalapril (VASOTEC) 20 MG tablet Take 1 tablet by mouth Twice daily.       No current facility-administered medications for this visit.    No Known Allergies  Review of Systems negative except from HPI and PMH  Physical Exam BP 148/97  Pulse 79  Ht 6' (1.829 m)  Wt 210 lb (95.255 kg)  BMI 28.47 kg/m2 Well developed and well nourished in no acute distress HENT normal E scleral and icterus clear Neck Supple JVP flat; carotids brisk and full Clear to ausculation  Regular rate and rhythm, 2-3/6 systolic m Soft with active bowel sounds No clubbing cyanosis Trace Edema Alert and oriented, grossly normal motor and sensory function Skin Warm and Dry   ECG demonstrates P. synchronous pacing in a sinus rhythm at 76   Assessment and  Plan  Ischemic cardiomyopathy ejection fraction 30-35%  Aortic stenosis-moderate  Congestive heart failure-chronic-systolic  Hypertension  ICD-[name]-CRT The patient's device was interrogated.  The information was reviewed. No changes were made in the programming.    Pain- right Hip  Fluid status is relatively stable.  No evidence of angina or symptoms to suggest ischemia.  His blood pressure is modestly elevated eye on this. He may need up titration of his medications.   I suggested that he might consider surgical consultation regarding his hip as it seems to be very limiting. He will follow this up with Dr. Patty SermonsBrackbill when he sees him next

## 2014-03-18 NOTE — Patient Instructions (Signed)
Your physician recommends that you continue on your current medications as directed. Please refer to the Current Medication list given to you today.  Remote monitoring is used to monitor your Pacemaker of ICD from home. This monitoring reduces the number of office visits required to check your device to one time per year. It allows us to keep an eye on the functioning of your device to ensure it is working properly. You are scheduled for a device check from home on 06/17/14. You may send your transmission at any time that day. If you have a wireless device, the transmission will be sent automatically. After your physician reviews your transmission, you will receive a postcard with your next transmission date.  Your physician wants you to follow-up in: 1 year with Dr. Klein.  You will receive a reminder letter in the mail two months in advance. If you don't receive a letter, please call our office to schedule the follow-up appointment.  

## 2014-05-02 ENCOUNTER — Other Ambulatory Visit (HOSPITAL_COMMUNITY): Payer: Self-pay | Admitting: Cardiology

## 2014-05-02 DIAGNOSIS — I714 Abdominal aortic aneurysm, without rupture, unspecified: Secondary | ICD-10-CM

## 2014-05-02 DIAGNOSIS — I711 Thoracic aortic aneurysm, ruptured, unspecified: Secondary | ICD-10-CM

## 2014-05-09 ENCOUNTER — Ambulatory Visit (HOSPITAL_COMMUNITY): Payer: Medicare Other | Attending: Cardiology | Admitting: Cardiology

## 2014-05-09 DIAGNOSIS — I714 Abdominal aortic aneurysm, without rupture, unspecified: Secondary | ICD-10-CM | POA: Diagnosis not present

## 2014-05-09 DIAGNOSIS — I723 Aneurysm of iliac artery: Secondary | ICD-10-CM

## 2014-05-09 DIAGNOSIS — I10 Essential (primary) hypertension: Secondary | ICD-10-CM | POA: Diagnosis not present

## 2014-05-09 DIAGNOSIS — E785 Hyperlipidemia, unspecified: Secondary | ICD-10-CM | POA: Diagnosis not present

## 2014-05-09 NOTE — Progress Notes (Signed)
Aorto-iliac duplex performed 

## 2014-05-24 IMAGING — CR DG HIP W/ PELVIS BILAT
6 series · 6 of 6 positions shown · non-contrast
Comparison: None.

CLINICAL DATA: Fall 3 days prior with bilateral hip pain

EXAM:
BILATERAL HIP WITH PELVIS - 4+ VIEW

[t pelvis a.p.]
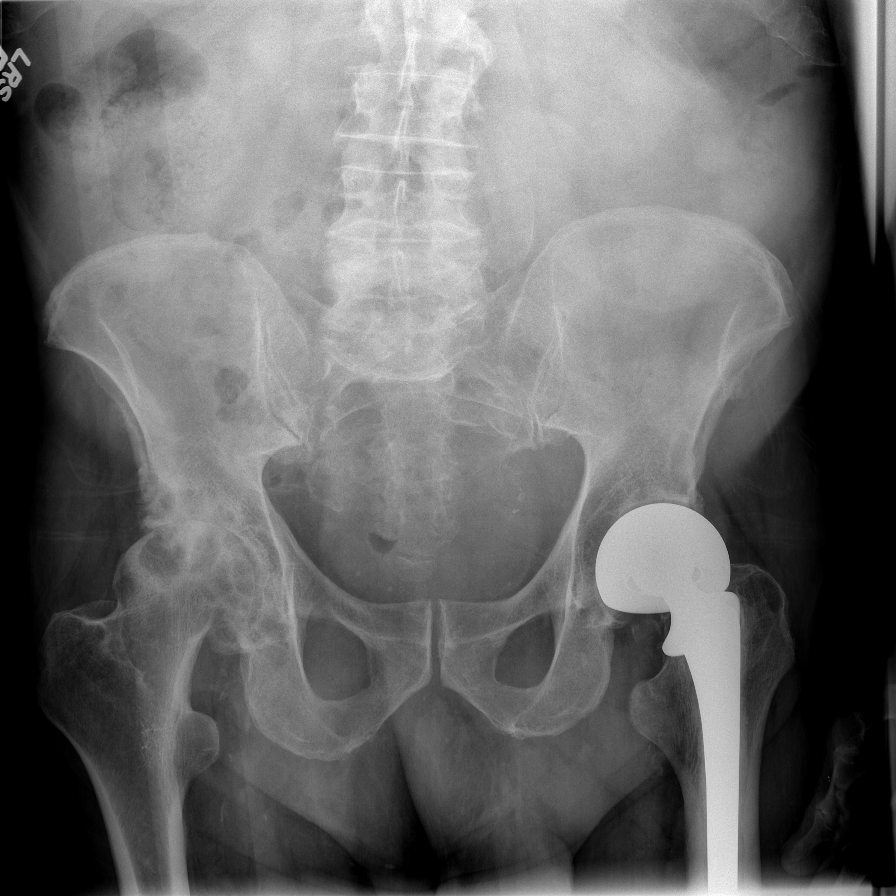

[t hip ap left (1 of 2)]
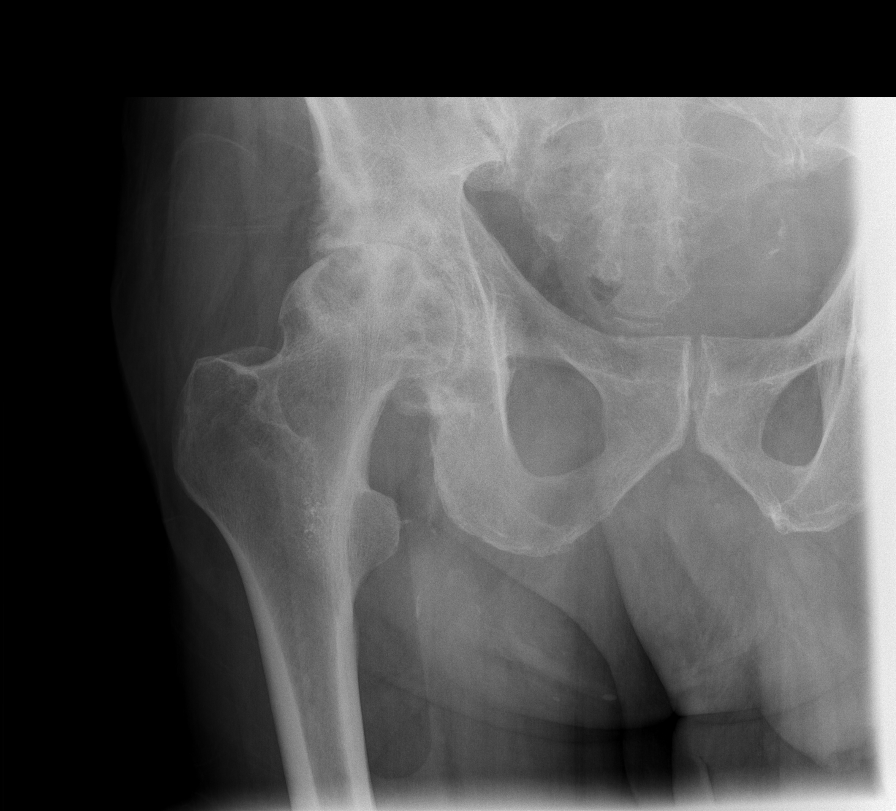

[t hip frog leg left]
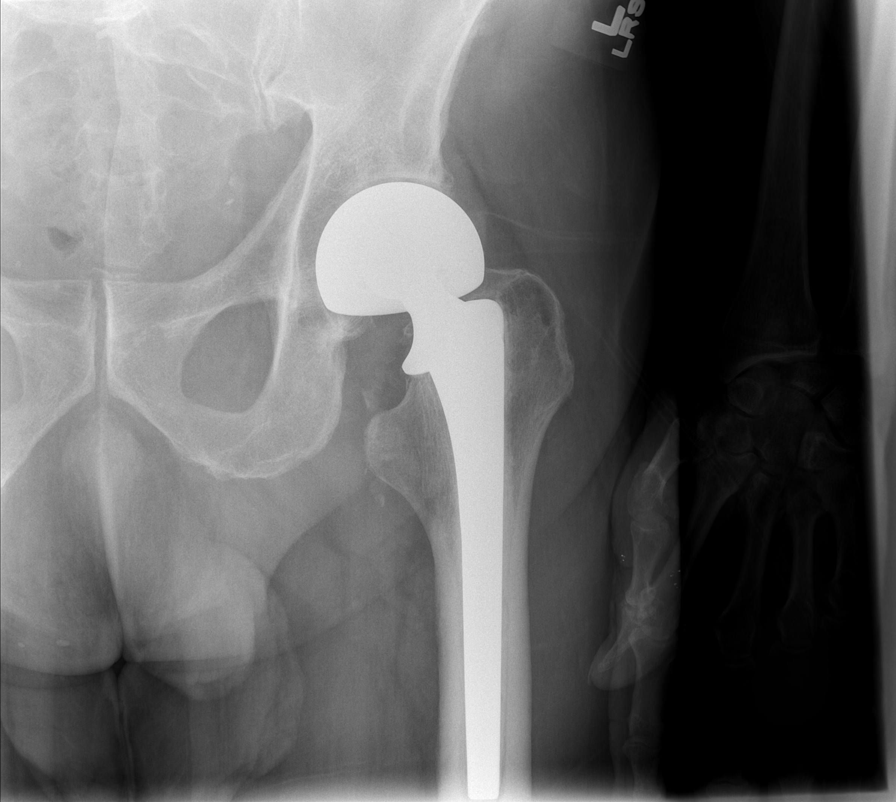

[t hip ap left (2 of 2)]
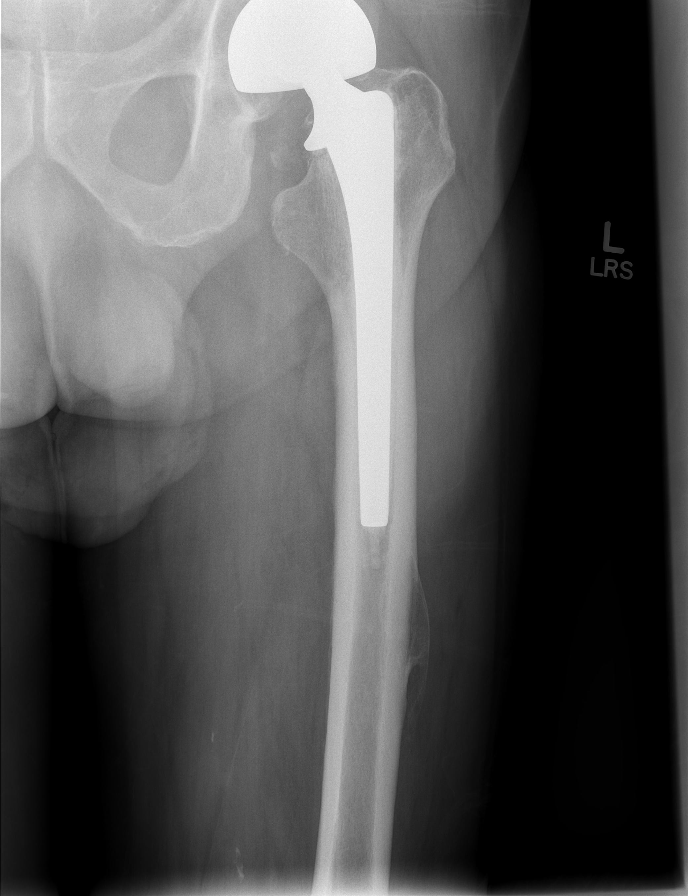

[t hip ap right]
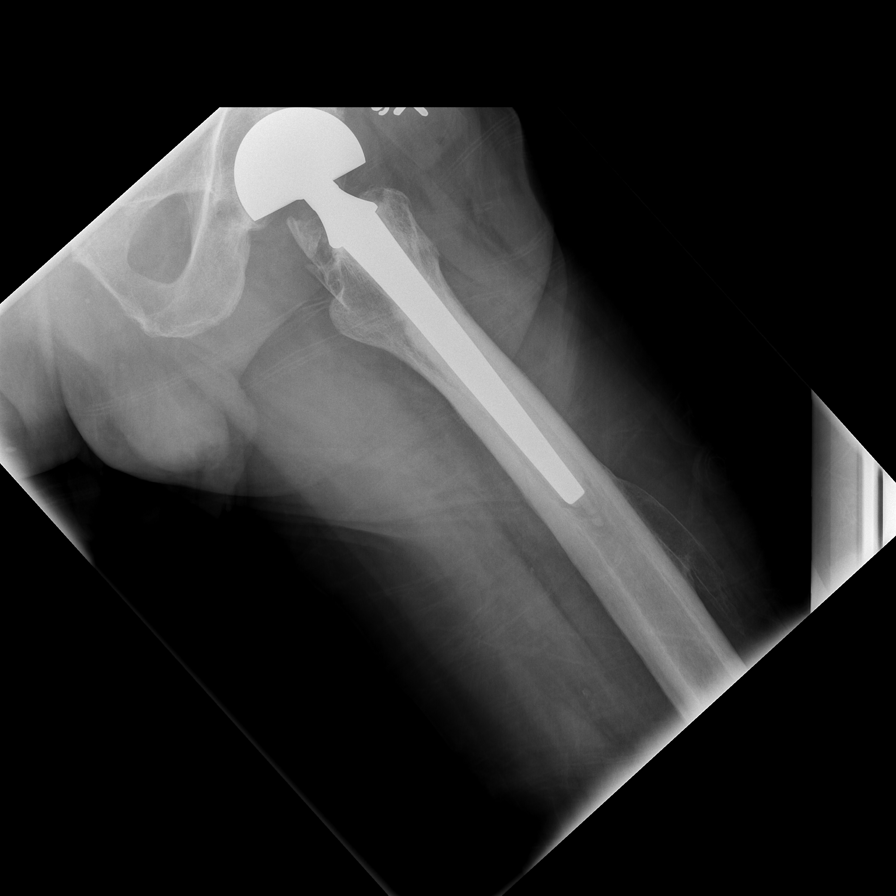

[t hip frog leg right]
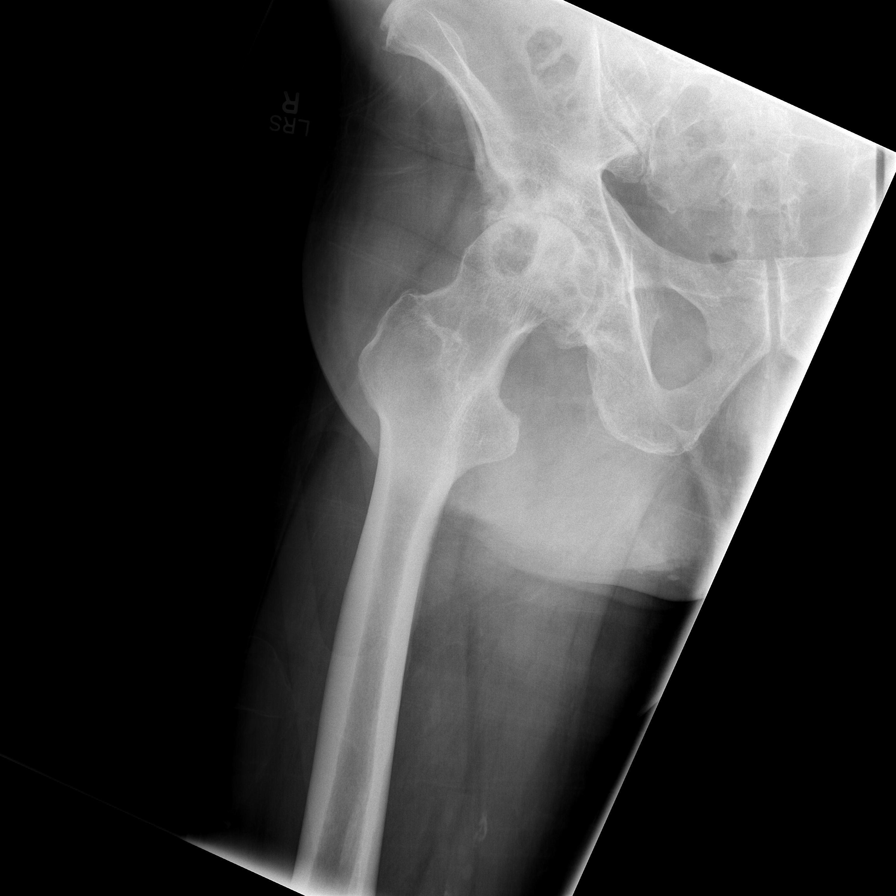

[6 of 6 positions shown; findings below may reference images not displayed]

FINDINGS: No acute proximal femur or pelvic ring fracture identified. No
diastasis.

Bipolar left hip hemiarthroplasty. The prosthesis is located and
there is no periprosthetic fracture.

Severe right hip osteoarthritis with superior lateral joint
narrowing. There are large subchondral cysts/geodes.

Thin, curvilinear calcification left of the lower lumbar spine in
this patient with chart history of abdominal aortic aneurysm.
IMPRESSION: 1. No acute osseous abnormality.
2. Located left hip hemiarthroplasty.
3. Severe right hip osteoarthritis.

## 2014-06-17 ENCOUNTER — Ambulatory Visit (INDEPENDENT_AMBULATORY_CARE_PROVIDER_SITE_OTHER): Payer: Medicare Other | Admitting: *Deleted

## 2014-06-17 ENCOUNTER — Encounter: Payer: Self-pay | Admitting: Internal Medicine

## 2014-06-17 DIAGNOSIS — I509 Heart failure, unspecified: Secondary | ICD-10-CM

## 2014-06-17 DIAGNOSIS — I255 Ischemic cardiomyopathy: Secondary | ICD-10-CM

## 2014-06-17 DIAGNOSIS — I5042 Chronic combined systolic (congestive) and diastolic (congestive) heart failure: Secondary | ICD-10-CM

## 2014-06-17 DIAGNOSIS — I2589 Other forms of chronic ischemic heart disease: Secondary | ICD-10-CM

## 2014-06-17 NOTE — Progress Notes (Signed)
Remote ICD transmission.   

## 2014-06-21 LAB — MDC_IDC_ENUM_SESS_TYPE_REMOTE
Battery Remaining Longevity: 40 mo
Battery Remaining Percentage: 70 %
Battery Voltage: 2.93 V
Brady Statistic AP VP Percent: 6.6 %
Brady Statistic AS VP Percent: 93 %
Brady Statistic AS VS Percent: 1 %
Brady Statistic RA Percent Paced: 6.5 %
Date Time Interrogation Session: 20150914071901
HIGH POWER IMPEDANCE MEASURED VALUE: 77 Ohm
HighPow Impedance: 77 Ohm
Implantable Pulse Generator Model: 3265
Lead Channel Impedance Value: 510 Ohm
Lead Channel Pacing Threshold Amplitude: 0.875 V
Lead Channel Pacing Threshold Amplitude: 2.25 V
Lead Channel Pacing Threshold Pulse Width: 0.5 ms
Lead Channel Pacing Threshold Pulse Width: 0.5 ms
Lead Channel Pacing Threshold Pulse Width: 1 ms
Lead Channel Sensing Intrinsic Amplitude: 9.7 mV
Lead Channel Setting Pacing Amplitude: 2 V
Lead Channel Setting Pacing Amplitude: 2 V
Lead Channel Setting Pacing Amplitude: 3.25 V
Lead Channel Setting Pacing Pulse Width: 0.5 ms
Lead Channel Setting Pacing Pulse Width: 1 ms
MDC IDC MSMT LEADCHNL RA IMPEDANCE VALUE: 360 Ohm
MDC IDC MSMT LEADCHNL RA PACING THRESHOLD AMPLITUDE: 1 V
MDC IDC MSMT LEADCHNL RA SENSING INTR AMPL: 2.7 mV
MDC IDC MSMT LEADCHNL RV IMPEDANCE VALUE: 340 Ohm
MDC IDC PG SERIAL: 7067361
MDC IDC SET LEADCHNL RV SENSING SENSITIVITY: 0.5 mV
MDC IDC SET ZONE DETECTION INTERVAL: 300 ms
MDC IDC STAT BRADY AP VS PERCENT: 1 %
Zone Setting Detection Interval: 250 ms

## 2014-07-08 ENCOUNTER — Encounter: Payer: Self-pay | Admitting: Cardiology

## 2014-07-17 ENCOUNTER — Telehealth: Payer: Self-pay | Admitting: Cardiology

## 2014-07-17 DIAGNOSIS — I119 Hypertensive heart disease without heart failure: Secondary | ICD-10-CM

## 2014-07-17 MED ORDER — AMLODIPINE BESYLATE 10 MG PO TABS: 10.0000 mg | ORAL_TABLET | Freq: Every day | ORAL | Status: AC

## 2014-07-17 NOTE — Addendum Note (Signed)
Addended by: Baird LyonsPRICE, Delainee Tramel L on: 07/17/2014 04:06 PM   Modules accepted: Orders

## 2014-07-17 NOTE — Telephone Encounter (Signed)
New message  Pt called states that his BP is not statble its been up and down.. No readings.Marland Kitchen.offered PA appt. Pt declined. Requests a call back from nurse. Please call

## 2014-07-17 NOTE — Telephone Encounter (Signed)
Have him increase amlodipine to 10mg daily

## 2014-07-17 NOTE — Telephone Encounter (Signed)
Patient explains that his BP has been high in the morning and drops back down after he takes his morning medication. He states BP is running 140/90-95 in the mornings. Will forward to Dr. Patty SermonsBrackbill and his nurse Juliette AlcideMelinda to address. Pt is aware and agreeable to plan.

## 2014-07-17 NOTE — Telephone Encounter (Signed)
Advised patient to increase Amlodipine to 10 mg per Dr. Patty SermonsBrackbill. New prescription not sent - pt will double what he has now to see how he responds to the increased dosage. Patient verbalized understanding and agreeable to plan.

## 2014-09-12 ENCOUNTER — Encounter (HOSPITAL_COMMUNITY): Payer: Self-pay | Admitting: Internal Medicine

## 2014-09-18 ENCOUNTER — Telehealth: Payer: Self-pay | Admitting: Cardiology

## 2014-09-18 ENCOUNTER — Encounter: Payer: Medicare Other | Admitting: *Deleted

## 2014-09-18 NOTE — Telephone Encounter (Signed)
Attempted to call pt and confirm remote transmission. No answer and unable to leave a message.  

## 2014-09-20 ENCOUNTER — Encounter: Payer: Self-pay | Admitting: Cardiology

## 2014-10-28 ENCOUNTER — Telehealth: Payer: Self-pay | Admitting: Internal Medicine

## 2014-10-28 NOTE — Telephone Encounter (Signed)
LMVOM w/ my direct # 
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
## 2014-10-31 NOTE — Telephone Encounter (Signed)
LMOVM w/ brief transmission instructions. Left my direct #.

## 2014-11-04 ENCOUNTER — Ambulatory Visit (INDEPENDENT_AMBULATORY_CARE_PROVIDER_SITE_OTHER): Payer: Medicare Other | Admitting: *Deleted

## 2014-11-04 ENCOUNTER — Encounter: Payer: Self-pay | Admitting: Internal Medicine

## 2014-11-04 DIAGNOSIS — I255 Ischemic cardiomyopathy: Secondary | ICD-10-CM

## 2014-11-04 DIAGNOSIS — I5042 Chronic combined systolic (congestive) and diastolic (congestive) heart failure: Secondary | ICD-10-CM

## 2014-11-04 LAB — MDC_IDC_ENUM_SESS_TYPE_REMOTE
Battery Remaining Percentage: 63 %
HighPow Impedance: 65 Ohm
Implantable Pulse Generator Model: 3265
Implantable Pulse Generator Serial Number: 7067361
Lead Channel Impedance Value: 490 Ohm
Lead Channel Pacing Threshold Amplitude: 2.75 V
Lead Channel Pacing Threshold Pulse Width: 0.5 ms
Lead Channel Pacing Threshold Pulse Width: 1 ms
Lead Channel Setting Pacing Amplitude: 2 V
Lead Channel Setting Pacing Amplitude: 2 V
Lead Channel Setting Pacing Amplitude: 3.75 V
Lead Channel Setting Sensing Sensitivity: 0.5 mV
MDC IDC MSMT LEADCHNL RA IMPEDANCE VALUE: 330 Ohm
MDC IDC MSMT LEADCHNL RA SENSING INTR AMPL: 2.1 mV
MDC IDC MSMT LEADCHNL RV IMPEDANCE VALUE: 340 Ohm
MDC IDC MSMT LEADCHNL RV PACING THRESHOLD AMPLITUDE: 1 V
MDC IDC MSMT LEADCHNL RV SENSING INTR AMPL: 7.4 mV
MDC IDC SET LEADCHNL LV PACING PULSEWIDTH: 1 ms
MDC IDC SET LEADCHNL RV PACING PULSEWIDTH: 0.5 ms
MDC IDC SET ZONE DETECTION INTERVAL: 300 ms
MDC IDC STAT BRADY RA PERCENT PACED: 8.6 %
MDC IDC STAT BRADY RV PERCENT PACED: 99 %
Zone Setting Detection Interval: 250 ms

## 2014-11-05 NOTE — Progress Notes (Signed)
Remote ICD transmission.   

## 2014-11-12 ENCOUNTER — Encounter: Payer: Self-pay | Admitting: Cardiology

## 2014-11-12 ENCOUNTER — Ambulatory Visit (INDEPENDENT_AMBULATORY_CARE_PROVIDER_SITE_OTHER): Payer: Medicare Other | Admitting: Cardiology

## 2014-11-12 VITALS — BP 144/82 | HR 68 | Ht 72.0 in | Wt 209.0 lb

## 2014-11-12 DIAGNOSIS — I714 Abdominal aortic aneurysm, without rupture, unspecified: Secondary | ICD-10-CM

## 2014-11-12 DIAGNOSIS — I350 Nonrheumatic aortic (valve) stenosis: Secondary | ICD-10-CM

## 2014-11-12 DIAGNOSIS — I119 Hypertensive heart disease without heart failure: Secondary | ICD-10-CM

## 2014-11-12 NOTE — Patient Instructions (Signed)
DECREASE YOUR SALT INTAKE  Your physician has requested that you have an echocardiogram. Echocardiography is a painless test that uses sound waves to create images of your heart. It provides your doctor with information about the size and shape of your heart and how well your heart's chambers and valves are working. This procedure takes approximately one hour. There are no restrictions for this procedure.  Your physician recommends that you continue on your current medications as directed. Please refer to the Current Medication list given to you today.  Your physician wants you to follow-up in: 6 MONTH OV You will receive a reminder letter in the mail two months in advance. If you don't receive a letter, please call our office to schedule the follow-up appointment.

## 2014-11-12 NOTE — Progress Notes (Signed)
Cardiology Office Note   Date:  11/12/2014   ID:  Corey Mccullough, DOB 10/09/1934, MRN [id number]  PCP:  Pcp Not In System  Cardiologist:   Cassell Clementhomas Bernadette Armijo, MD   No chief complaint on file.     History of Present Illness: Corey Mccullough is a 80 y.o. male who presents for scheduled follow-up office visit.  This pleasant 80 year old gentleman is seen for a scheduled followup office visit. he has a past history of ischemic cardiomyopathy. On 12/11/12 he underwent implantation of a CRT implantable cardiac defibrillator by Dr. Graciela HusbandsKlein. He has a past history of moderate aortic stenosis with low gradient secondary to left ventricular dysfunction. He had an echocardiogram 04/17/13 which showed improvement in his ejection fraction from 30-35% to 50% now. He has moderate but not severe aortic stenosis with peak gradient of 47 and mean gradient of 32 by echocardiogram on 04/17/13. Patient states that his shortness of breath is much improved since his pacemaker defibrillator was implanted. The patient has a known abdominal aortic aneurysm. Followup aortic duplex done on 09/11/13 shows slight increase in the size of the aneurysm which now measures 4.6 cm x 4.8 cm on preliminary reading. Since last visit he has had no new cardiac symptoms. His blood pressure has improved since the addition of amlodipine. He denies any chest pain or shortness of breath or dizzy spells.  He notes occasional palpitations.  His blood pressure continues to run slightly elevated.  He admits to salting his food heavily.  Past Medical History  Diagnosis Date  . Hypertension   . Chronic systolic heart failure   . Left ventricular systolic dysfunction   . Aortic stenosis     Moderate-severe 11/13 mean gradient 27 EF 30-35%  . Left bundle branch block   . Hypercholesteremia   . GERD (gastroesophageal reflux disease)   . Ischemic cardiomyopathy     prior IMI  . CHF (congestive heart failure)   . Myocardial infarction   . ICD  (implantable cardiac defibrillator) in place 12/11/2012    dual chamber  Dr Graciela HusbandsKlein  . Shortness of breath   . Stroke     TIA  . Arthritis     Past Surgical History  Procedure Laterality Date  . Defibrilator  12/11/2012    dual chamber   by Dr Graciela HusbandsKlein  . Cataract extraction    . Partial hip arthroplasty    . Bi-ventricular implantable cardioverter defibrillator N/A 12/11/2012    Procedure: BI-VENTRICULAR IMPLANTABLE CARDIOVERTER DEFIBRILLATOR  (CRT-D);  Surgeon: Duke SalviaSteven C Klein, MD;  [facility];  Service: Cardiovascular;  Laterality: N/A;     Current Outpatient Prescriptions  Medication Sig Dispense Refill  . amLODipine (NORVASC) 10 MG tablet Take 1 tablet (10 mg total) by mouth daily. 90 tablet 3  . aspirin 81 MG tablet Take 1 tablet (81 mg total) by mouth daily.    Marland Kitchen. atorvastatin (LIPITOR) 80 MG tablet Take 1 tablet (80 mg total) by mouth daily. 90 tablet 3  . carvedilol (COREG) 25 MG tablet Take 1 tablet (25 mg total) by mouth 2 (two) times daily with a meal. 180 tablet 3  . diltiazem (CARDIZEM) 30 MG tablet Take 30 mg by mouth every 6 (six) hours as needed (palpitations).    . losartan-hydrochlorothiazide (HYZAAR) 100-25 MG per tablet Take one tablet by mouth every morning 90 tablet 3  . omeprazole (PRILOSEC) 20 MG capsule Take 20 mg by mouth daily.      . [DISCONTINUED] enalapril (VASOTEC) 20  MG tablet Take 1 tablet by mouth Twice daily.     No current facility-administered medications for this visit.    Allergies:   Review of patient's allergies indicates no known allergies.    Social History:  The patient  reports that he quit smoking about 8 years ago. His smoking use included Cigarettes. He has a 50 pack-year smoking history. He has never used smokeless tobacco. He reports that he drinks alcohol. He reports that he does not use illicit drugs.   Family History:  The patient's family history is not on file. He was adopted.    ROS:  Please see the history of present  illness.   Otherwise, review of systems are positive for none.   All other systems are reviewed and negative.    PHYSICAL EXAM: VS:  BP 144/82 mmHg  Pulse 68  Ht 6' (1.829 m)  Wt 209 lb (94.802 kg)  BMI 28.34 kg/m2 , BMI Body mass index is 28.34 kg/(m^2). GEN: Well nourished, well developed, in no acute distress HEENT: normal Neck: no JVD, carotid bruits, or masses Cardiac: RRR; no murmurs, rubs, or gallops,no edema  Respiratory:  clear to auscultation bilaterally, normal work of breathing GI: soft, nontender, nondistended, + BS MS: no deformity or atrophy Skin: warm and dry, no rash Neuro:  Strength and sensation are intact Psych: euthymic mood, full affect   EKG:  EKG is ordered today. The ekg ordered today demonstrates normal sinus rhythm with ventricular pacing   Recent Labs: 02/04/2014: BUN 11; Creatinine 0.93; Hemoglobin 14.6; Platelets 188; Potassium 3.5*; Sodium 140    Lipid Panel    Component Value Date/Time   CHOL 124 09/11/2013 1036   TRIG 99.0 09/11/2013 1036   HDL 38.10* 09/11/2013 1036   CHOLHDL 3 09/11/2013 1036   VLDL 19.8 09/11/2013 1036   LDLCALC 66 09/11/2013 1036   LDLDIRECT 132.1 12/05/2012 1245      Wt Readings from Last 3 Encounters:  11/12/14 209 lb (94.802 kg)  03/18/14 210 lb (95.255 kg)  02/04/14 170 lb (77.111 kg)      Other studies Reviewed:    ASSESSMENT AND PLAN: 1.ischemic cardiomyopathy with [employer]. Jude CRT ICD. 2.  Moderate aortic stenosis 3.  Essential hypertension 4.  Abdominal aortic aneurysm    Current medicines are reviewed at length with the patient today.  The patient does not have concerns regarding medicines.  The following changes have been made:  no change    Orders Placed This Encounter  Procedures  . EKG 12-Lead  . 2D Echocardiogram without contrast     Disposition:   FU with Dr. Patty Sermons in 6 months for office visit. He was encouraged to cut down on dietary salt. We will update his 2-D  echo.   Signed, Cassell Clement, MD  11/12/2014 6:53 PM    [facility] [address] [place], [place], [place]  [address] Phone: [phone]; Fax: [phone]

## 2014-11-22 ENCOUNTER — Ambulatory Visit (HOSPITAL_COMMUNITY): Payer: Medicare Other | Attending: Cardiovascular Disease | Admitting: Radiology

## 2014-11-22 DIAGNOSIS — I359 Nonrheumatic aortic valve disorder, unspecified: Secondary | ICD-10-CM

## 2014-11-22 DIAGNOSIS — I119 Hypertensive heart disease without heart failure: Secondary | ICD-10-CM

## 2014-11-22 DIAGNOSIS — I350 Nonrheumatic aortic (valve) stenosis: Secondary | ICD-10-CM

## 2014-11-22 DIAGNOSIS — E780 Pure hypercholesterolemia: Secondary | ICD-10-CM | POA: Insufficient documentation

## 2014-11-22 DIAGNOSIS — Z87891 Personal history of nicotine dependence: Secondary | ICD-10-CM | POA: Diagnosis not present

## 2014-11-22 NOTE — Progress Notes (Signed)
Echocardiogram performed.  

## 2014-12-03 ENCOUNTER — Ambulatory Visit (INDEPENDENT_AMBULATORY_CARE_PROVIDER_SITE_OTHER): Payer: Medicare Other | Admitting: Cardiology

## 2014-12-03 ENCOUNTER — Encounter: Payer: Self-pay | Admitting: Cardiology

## 2014-12-03 VITALS — BP 124/70 | HR 77 | Ht 72.0 in | Wt 208.4 lb

## 2014-12-03 DIAGNOSIS — I350 Nonrheumatic aortic (valve) stenosis: Secondary | ICD-10-CM

## 2014-12-03 DIAGNOSIS — I255 Ischemic cardiomyopathy: Secondary | ICD-10-CM

## 2014-12-03 DIAGNOSIS — I119 Hypertensive heart disease without heart failure: Secondary | ICD-10-CM

## 2014-12-03 NOTE — Progress Notes (Signed)
Cardiology Office Note   Date:  12/03/2014   ID:  Corey Mccullough, DOB October 07, 1934, MRN [id number]  PCP:  Pcp Not In System  Cardiologist:   Cassell Clement, MD   No chief complaint on file.     History of Present Illness: Corey Mccullough is a 80 y.o. male who presents for follow-up office visit.  This pleasant 80 year old gentleman is seen for a scheduled followup office visit. he has a past history of ischemic cardiomyopathy. On 12/11/12 he underwent implantation of a CRT implantable cardiac defibrillator by Dr. Graciela Husbands. He has a past history of moderate aortic stenosis with low gradient secondary to left ventricular dysfunction. He had an echocardiogram 04/17/13 which showed improvement in his ejection fraction from 30-35% to 50% now. He had moderate but not severe aortic stenosis with peak gradient of 47 and mean gradient of 32 by echocardiogram on 04/17/13.  The patient had a follow-up echocardiogram on 11/22/14 which showed a slight drop in his ejection fraction to 40-45%.  Despite the drop in ejection fraction, his aortic valve gradient actually increased to a peak of 70 and a mean of 43.  Despite worsening echocardiographic evidence of aortic stenosis, the patient denies symptoms.  He denies any chest pain or shortness of breath.  He has not been having any dizziness or syncope.  He feels that his energy is normal for his age.  He is slow to get going in the morning because of stiffness from osteoarthritis.  He does better later in the day and better in the warm weather Patient states that his shortness of breath is much improved since his pacemaker defibrillator was implanted.  The patient has a known abdominal aortic aneurysm. Followup aortic duplex done on 09/11/13 shows slight increase in the size of the aneurysm which now measures 4.6 cm x 4.8 cm on preliminary reading. Since last visit he has had no new cardiac symptoms. His blood pressure has improved since the addition of  amlodipine.  Past Medical History  Diagnosis Date  . Hypertension   . Chronic systolic heart failure   . Left ventricular systolic dysfunction   . Aortic stenosis     Moderate-severe 11/13 mean gradient 27 EF 30-35%  . Left bundle branch block   . Hypercholesteremia   . GERD (gastroesophageal reflux disease)   . Ischemic cardiomyopathy     prior IMI  . CHF (congestive heart failure)   . Myocardial infarction   . ICD (implantable cardiac defibrillator) in place 12/11/2012    dual chamber  Dr Graciela Husbands  . Shortness of breath   . Stroke     TIA  . Arthritis     Past Surgical History  Procedure Laterality Date  . Defibrilator  12/11/2012    dual chamber   by Dr Graciela Husbands  . Cataract extraction    . Partial hip arthroplasty    . Bi-ventricular implantable cardioverter defibrillator N/A 12/11/2012    Procedure: BI-VENTRICULAR IMPLANTABLE CARDIOVERTER DEFIBRILLATOR  (CRT-D);  Surgeon: Duke Salvia, MD;  Location: [facility] C[facility] LAB;  Service: Cardiovascular;  Laterality: N/A;     Current Outpatient Prescriptions  Medication Sig Dispense Refill  . amLODipine (NORVASC) 10 MG tablet Take 1 tablet (10 mg total) by mouth daily. 90 tablet 3  . aspirin 81 MG tablet Take 1 tablet (81 mg total) by mouth daily.    Marland Kitchen atorvastatin (LIPITOR) 80 MG tablet Take 1 tablet (80 mg total) by mouth daily. 90 tablet 3  . carvedilol (COREG) 25  MG tablet Take 1 tablet (25 mg total) by mouth 2 (two) times daily with a meal. 180 tablet 3  . diltiazem (CARDIZEM) 30 MG tablet Take 30 mg by mouth every 6 (six) hours as needed (palpitations).    . losartan-hydrochlorothiazide (HYZAAR) 100-25 MG per tablet Take one tablet by mouth every morning 90 tablet 3  . omeprazole (PRILOSEC) 20 MG capsule Take 20 mg by mouth daily.      . [DISCONTINUED] enalapril (VASOTEC) 20 MG tablet Take 1 tablet by mouth Twice daily.     No current facility-administered medications for this visit.    Allergies:   Review of patient's allergies  indicates no known allergies.    Social History:  The patient  reports that he quit smoking about 8 years ago. His smoking use included Cigarettes. He has a 50 pack-year smoking history. He has never used smokeless tobacco. He reports that he drinks alcohol. He reports that he does not use illicit drugs.   Family History:  The patient's family history is not on file. He was adopted.    ROS:  Please see the history of present illness.   Otherwise, review of systems are positive for none.   All other systems are reviewed and negative.    PHYSICAL EXAM: VS:  BP 124/70 mmHg  Pulse 77  Ht 6' (1.829 m)  Wt 208 lb 6.4 oz (94.53 kg)  BMI 28.26 kg/m2 , BMI Body mass index is 28.26 kg/(m^2). GEN: Well nourished, well developed, in no acute distress HEENT: normal Neck: no JVD, carotid bruits, or masses Cardiac: RRR; no rubs, or gallops,no edema .  There is a grade 3/6 harsh systolic ejection murmur of aortic stenosis.  Carotid pulse upstroke is somewhat slowed. Respiratory:  clear to auscultation bilaterally, normal work of breathing GI: soft, nontender, nondistended, + BS MS: no deformity or atrophy Skin: warm and dry, no rash Neuro:  Strength and sensation are intact Psych: euthymic mood, full affect   EKG:  EKG is not ordered today.    Recent Labs: 02/04/2014: BUN 11; Creatinine 0.93; Hemoglobin 14.6; Platelets 188; Potassium 3.5*; Sodium 140    Lipid Panel    Component Value Date/Time   CHOL 124 09/11/2013 1036   TRIG 99.0 09/11/2013 1036   HDL 38.10* 09/11/2013 1036   CHOLHDL 3 09/11/2013 1036   VLDL 19.8 09/11/2013 1036   LDLCALC 66 09/11/2013 1036   LDLDIRECT 132.1 12/05/2012 1245      Wt Readings from Last 3 Encounters:  12/03/14 208 lb 6.4 oz (94.53 kg)  11/12/14 209 lb (94.802 kg)  03/18/14 210 lb (95.255 kg)         ASSESSMENT AND PLAN:  1.  Severe aortic stenosis by echocardiogram.  Peak gradient of 70, mean gradient of 43 on echocardiogram 11/22/14.   Denies any symptoms 2.  Ischemic cardiomyopathy, status post implantation of a CRT-D in 2014 3.  Abdominal aortic aneurysm, asymptomatic.  Last ultrasound of 05/09/14 showed aneurysm of 4.34.9 with moderate thrombus burden. 4.  Essential hypertension, controlled on current medication   Current medicines are reviewed at length with the patient today.  The patient does not have concerns regarding medicines.  The following changes have been made:  no change  Labs/ tests ordered today include: None  No orders of the defined types were placed in this encounter.   Plan: I discussed the possibility of exploring aortic valve replacement.  Possibly a candidate for TAVR.  His gradients would qualify him for aortic  valve replacement.  However, the patient is not interested in pursuing aortic valve replacement at this time because he is not having any symptoms.  We will continue to follow closely.  We will plan to see him back in 4 months for office visit and EKG.  Consider follow-up abdominal aortic ultrasound at that time as well to follow his AAA  Disposition:   FU with Dr. Patty Sermons in 4 months for office visit and EKG   Signed, Cassell Clement, MD  12/03/2014 2:41 PM    [facility] [address] [place], [place], [place]  [address] Phone: [phone]; Fax: [phone]

## 2014-12-03 NOTE — Patient Instructions (Signed)
Your physician recommends that you continue on your current medications as directed. Please refer to the Current Medication list given to you today.  Your physician wants you to follow-up in: 4 MONTH OV/EKG You will receive a reminder letter in the mail two months in advance. If you don't receive a letter, please call our office to schedule the follow-up appointment.  

## 2015-01-03 DEATH — deceased

## 2015-04-04 ENCOUNTER — Encounter: Payer: Self-pay | Admitting: *Deleted

## 2015-04-29 ENCOUNTER — Encounter: Payer: Self-pay | Admitting: *Deleted
